# Patient Record
Sex: Male | Born: 1965 | Race: White | Hispanic: No | Marital: Single | Smoking: Current every day smoker
Health system: Southern US, Community
[De-identification: ages and names within clinical notes are randomized; demographics above are authoritative.]

## PROBLEM LIST (undated history)

## (undated) DIAGNOSIS — Z720 Tobacco use: Secondary | ICD-10-CM

## (undated) DIAGNOSIS — F1010 Alcohol abuse, uncomplicated: Secondary | ICD-10-CM

---

## 2020-08-13 ENCOUNTER — Encounter: Payer: Self-pay | Admitting: Emergency Medicine

## 2020-08-13 ENCOUNTER — Emergency Department: Payer: BC Managed Care – PPO

## 2020-08-13 ENCOUNTER — Other Ambulatory Visit: Payer: Self-pay

## 2020-08-13 ENCOUNTER — Inpatient Hospital Stay
Admission: EM | Admit: 2020-08-13 | Discharge: 2020-08-15 | DRG: 308 | Disposition: A | Discharge status: Home / Self Care | Payer: BC Managed Care – PPO | Attending: Internal Medicine | Admitting: Internal Medicine

## 2020-08-13 DIAGNOSIS — F419 Anxiety disorder, unspecified: Secondary | ICD-10-CM | POA: Diagnosis present

## 2020-08-13 DIAGNOSIS — E118 Type 2 diabetes mellitus with unspecified complications: Secondary | ICD-10-CM | POA: Diagnosis not present

## 2020-08-13 DIAGNOSIS — R739 Hyperglycemia, unspecified: Secondary | ICD-10-CM | POA: Diagnosis present

## 2020-08-13 DIAGNOSIS — I10 Essential (primary) hypertension: Secondary | ICD-10-CM | POA: Diagnosis present

## 2020-08-13 DIAGNOSIS — I480 Paroxysmal atrial fibrillation: Principal | ICD-10-CM | POA: Diagnosis present

## 2020-08-13 DIAGNOSIS — Z808 Family history of malignant neoplasm of other organs or systems: Secondary | ICD-10-CM | POA: Diagnosis not present

## 2020-08-13 DIAGNOSIS — G4700 Insomnia, unspecified: Secondary | ICD-10-CM | POA: Diagnosis present

## 2020-08-13 DIAGNOSIS — K5900 Constipation, unspecified: Secondary | ICD-10-CM | POA: Diagnosis present

## 2020-08-13 DIAGNOSIS — E871 Hypo-osmolality and hyponatremia: Secondary | ICD-10-CM | POA: Diagnosis present

## 2020-08-13 DIAGNOSIS — R7303 Prediabetes: Secondary | ICD-10-CM | POA: Diagnosis not present

## 2020-08-13 DIAGNOSIS — K2980 Duodenitis without bleeding: Secondary | ICD-10-CM | POA: Diagnosis not present

## 2020-08-13 DIAGNOSIS — Z720 Tobacco use: Secondary | ICD-10-CM

## 2020-08-13 DIAGNOSIS — F1010 Alcohol abuse, uncomplicated: Secondary | ICD-10-CM | POA: Diagnosis present

## 2020-08-13 DIAGNOSIS — G4733 Obstructive sleep apnea (adult) (pediatric): Secondary | ICD-10-CM | POA: Diagnosis present

## 2020-08-13 DIAGNOSIS — R109 Unspecified abdominal pain: Secondary | ICD-10-CM | POA: Diagnosis not present

## 2020-08-13 DIAGNOSIS — E878 Other disorders of electrolyte and fluid balance, not elsewhere classified: Secondary | ICD-10-CM | POA: Diagnosis present

## 2020-08-13 DIAGNOSIS — R079 Chest pain, unspecified: Secondary | ICD-10-CM | POA: Diagnosis not present

## 2020-08-13 DIAGNOSIS — I4891 Unspecified atrial fibrillation: Secondary | ICD-10-CM | POA: Diagnosis not present

## 2020-08-13 DIAGNOSIS — Z6837 Body mass index (BMI) 37.0-37.9, adult: Secondary | ICD-10-CM | POA: Diagnosis not present

## 2020-08-13 DIAGNOSIS — F17210 Nicotine dependence, cigarettes, uncomplicated: Secondary | ICD-10-CM | POA: Diagnosis not present

## 2020-08-13 DIAGNOSIS — I700 Atherosclerosis of aorta: Secondary | ICD-10-CM | POA: Diagnosis not present

## 2020-08-13 DIAGNOSIS — K852 Alcohol induced acute pancreatitis without necrosis or infection: Secondary | ICD-10-CM | POA: Diagnosis present

## 2020-08-13 DIAGNOSIS — Z20822 Contact with and (suspected) exposure to covid-19: Secondary | ICD-10-CM | POA: Diagnosis present

## 2020-08-13 DIAGNOSIS — I160 Hypertensive urgency: Secondary | ICD-10-CM | POA: Diagnosis present

## 2020-08-13 HISTORY — DX: Tobacco use: Z72.0

## 2020-08-13 HISTORY — DX: Alcohol abuse, uncomplicated: F10.10

## 2020-08-13 LAB — COMPREHENSIVE METABOLIC PANEL
ALT: 26 U/L (ref 0–44)
AST: 25 U/L (ref 15–41)
Albumin: 4.2 g/dL (ref 3.5–5.0)
Alkaline Phosphatase: 71 U/L (ref 38–126)
Anion gap: 10 (ref 5–15)
BUN: 11 mg/dL (ref 6–20)
CO2: 27 mmol/L (ref 22–32)
Calcium: 9.6 mg/dL (ref 8.9–10.3)
Chloride: 97 mmol/L — ABNORMAL LOW (ref 98–111)
Creatinine, Ser: 0.8 mg/dL (ref 0.61–1.24)
GFR, Estimated: >60 mL/min (ref >60)
Glucose, Bld: 138 mg/dL — ABNORMAL HIGH (ref 70–99)
Potassium: 3.9 mmol/L (ref 3.5–5.1)
Sodium: 134 mmol/L — ABNORMAL LOW (ref 135–145)
Total Bilirubin: 1.3 mg/dL — ABNORMAL HIGH (ref 0.3–1.2)
Total Protein: 8.2 g/dL — ABNORMAL HIGH (ref 6.5–8.1)

## 2020-08-13 LAB — CBC
HCT: 51.2 % (ref 39.0–52.0)
Hemoglobin: 17.9 g/dL — ABNORMAL HIGH (ref 13.0–17.0)
MCH: 31.2 pg (ref 26.0–34.0)
MCHC: 35 g/dL (ref 30.0–36.0)
MCV: 89.4 fL (ref 80.0–100.0)
Platelets: 261 10*3/uL (ref 150–400)
RBC: 5.73 MIL/uL (ref 4.22–5.81)
RDW: 11.8 % (ref 11.5–15.5)
WBC: 17.6 10*3/uL — ABNORMAL HIGH (ref 4.0–10.5)
nRBC: 0 % (ref 0.0–0.2)

## 2020-08-13 LAB — APTT: aPTT: 34 seconds (ref 24–36)

## 2020-08-13 LAB — URINALYSIS, COMPLETE (UACMP) WITH MICROSCOPIC
Bacteria, UA: NONE SEEN
Bilirubin Urine: NEGATIVE
Glucose, UA: NEGATIVE mg/dL
Ketones, ur: NEGATIVE mg/dL
Leukocytes,Ua: NEGATIVE
Nitrite: NEGATIVE
Protein, ur: NEGATIVE mg/dL
Specific Gravity, Urine: 1.009 (ref 1.005–1.030)
pH: 7 (ref 5.0–8.0)

## 2020-08-13 LAB — TROPONIN I (HIGH SENSITIVITY)
Troponin I (High Sensitivity): 15 ng/L (ref <18)
Troponin I (High Sensitivity): 19 ng/L — ABNORMAL HIGH (ref <18)

## 2020-08-13 LAB — T4, FREE: Free T4: 1.1 ng/dL (ref 0.61–1.12)

## 2020-08-13 LAB — RESP PANEL BY RT-PCR (FLU A&B, COVID) ARPGX2
Influenza A by PCR: NEGATIVE
Influenza B by PCR: NEGATIVE
SARS Coronavirus 2 by RT PCR: NEGATIVE

## 2020-08-13 LAB — BRAIN NATRIURETIC PEPTIDE: B Natriuretic Peptide: 122.4 pg/mL — ABNORMAL HIGH (ref 0.0–100.0)

## 2020-08-13 LAB — PROTIME-INR
INR: 1.2 (ref 0.8–1.2)
Prothrombin Time: 14.7 seconds (ref 11.4–15.2)

## 2020-08-13 LAB — LIPASE, BLOOD: Lipase: 38 U/L (ref 11–51)

## 2020-08-13 LAB — HEPARIN LEVEL (UNFRACTIONATED): Heparin Unfractionated: 0.18 IU/mL — ABNORMAL LOW (ref 0.30–0.70)

## 2020-08-13 LAB — TSH: TSH: 2.647 u[IU]/mL (ref 0.350–4.500)

## 2020-08-13 LAB — LACTIC ACID, PLASMA: Lactic Acid, Venous: 1.4 mmol/L (ref 0.5–1.9)

## 2020-08-13 LAB — ETHANOL: Alcohol, Ethyl (B): <10 mg/dL (ref <10)

## 2020-08-13 MED ORDER — SODIUM CHLORIDE 0.9 % IV BOLUS
1000.0000 mL | Freq: Once | INTRAVENOUS | Status: AC
  Administered 2020-08-13: 1000 mL via INTRAVENOUS

## 2020-08-13 MED ORDER — SODIUM CHLORIDE 0.9 % IV SOLN: INTRAVENOUS | Status: DC

## 2020-08-13 MED ORDER — HYDROMORPHONE HCL 1 MG/ML IJ SOLN
0.5000 mg | Freq: Once | INTRAMUSCULAR | Status: AC
  Administered 2020-08-13: 0.5 mg via INTRAVENOUS
  Filled 2020-08-13: qty 1

## 2020-08-13 MED ORDER — PANTOPRAZOLE SODIUM 40 MG IV SOLR
40.0000 mg | Freq: Once | INTRAVENOUS | Status: AC
  Administered 2020-08-13: 40 mg via INTRAVENOUS
  Filled 2020-08-13: qty 40

## 2020-08-13 MED ORDER — HEPARIN (PORCINE) 25000 UT/250ML-% IV SOLN
2100.0000 [IU]/h | INTRAVENOUS | Status: DC
  Administered 2020-08-13: 1500 [IU]/h via INTRAVENOUS
  Administered 2020-08-14: 1900 [IU]/h via INTRAVENOUS
  Administered 2020-08-14 – 2020-08-15 (×2): 2100 [IU]/h via INTRAVENOUS
  Filled 2020-08-13 (×4): qty 250

## 2020-08-13 MED ORDER — IOHEXOL 350 MG/ML SOLN
100.0000 mL | Freq: Once | INTRAVENOUS | Status: AC | PRN
  Administered 2020-08-13: 100 mL via INTRAVENOUS

## 2020-08-13 MED ORDER — LORAZEPAM 2 MG/ML IJ SOLN
1.0000 mg | INTRAMUSCULAR | Status: DC | PRN
  Administered 2020-08-14: 1 mg via INTRAVENOUS
  Filled 2020-08-13: qty 1

## 2020-08-13 MED ORDER — ONDANSETRON HCL 4 MG/2ML IJ SOLN
4.0000 mg | Freq: Once | INTRAMUSCULAR | Status: AC
  Administered 2020-08-13: 4 mg via INTRAVENOUS
  Filled 2020-08-13: qty 2

## 2020-08-13 MED ORDER — ONDANSETRON HCL 4 MG PO TABS: 4.0000 mg | ORAL_TABLET | Freq: Four times a day (QID) | ORAL | Status: DC | PRN

## 2020-08-13 MED ORDER — ENOXAPARIN SODIUM 40 MG/0.4ML IJ SOSY: 40.0000 mg | PREFILLED_SYRINGE | INTRAMUSCULAR | Status: DC

## 2020-08-13 MED ORDER — DILTIAZEM HCL 25 MG/5ML IV SOLN
15.0000 mg | Freq: Once | INTRAVENOUS | Status: AC
  Administered 2020-08-13: 15 mg via INTRAVENOUS
  Filled 2020-08-13: qty 5

## 2020-08-13 MED ORDER — MORPHINE SULFATE (PF) 2 MG/ML IV SOLN
2.0000 mg | INTRAVENOUS | Status: DC | PRN
  Administered 2020-08-13 – 2020-08-14 (×3): 2 mg via INTRAVENOUS
  Filled 2020-08-13 (×3): qty 1

## 2020-08-13 MED ORDER — DILTIAZEM HCL-DEXTROSE 125-5 MG/125ML-% IV SOLN (PREMIX)
5.0000 mg/h | INTRAVENOUS | Status: DC
Start: 2020-08-13 — End: 2020-08-14
  Administered 2020-08-13: 5 mg/h via INTRAVENOUS
  Filled 2020-08-13: qty 125

## 2020-08-13 MED ORDER — DILTIAZEM HCL 25 MG/5ML IV SOLN
25.0000 mg | Freq: Once | INTRAVENOUS | Status: AC
  Administered 2020-08-13: 25 mg via INTRAVENOUS
  Filled 2020-08-13: qty 5

## 2020-08-13 MED ORDER — ONDANSETRON HCL 4 MG/2ML IJ SOLN
4.0000 mg | Freq: Four times a day (QID) | INTRAMUSCULAR | Status: DC | PRN
  Administered 2020-08-13: 4 mg via INTRAVENOUS
  Filled 2020-08-13: qty 2

## 2020-08-13 MED ORDER — HEPARIN BOLUS VIA INFUSION
6000.0000 [IU] | Freq: Once | INTRAVENOUS | Status: AC
  Administered 2020-08-13: 6000 [IU] via INTRAVENOUS
  Filled 2020-08-13: qty 6000

## 2020-08-13 MED ORDER — THIAMINE HCL 100 MG/ML IJ SOLN
Freq: Once | INTRAVENOUS | Status: AC
  Filled 2020-08-13: qty 1000

## 2020-08-13 MED ORDER — ACETAMINOPHEN 650 MG RE SUPP: 650.0000 mg | Freq: Four times a day (QID) | RECTAL | Status: DC | PRN

## 2020-08-13 MED ORDER — ACETAMINOPHEN 325 MG PO TABS
650.0000 mg | ORAL_TABLET | Freq: Four times a day (QID) | ORAL | Status: DC | PRN
  Administered 2020-08-13 – 2020-08-14 (×3): 650 mg via ORAL
  Filled 2020-08-13 (×3): qty 2

## 2020-08-13 MED ORDER — TRAZODONE HCL 50 MG PO TABS
25.0000 mg | ORAL_TABLET | Freq: Every evening | ORAL | Status: DC | PRN
  Administered 2020-08-14: 25 mg via ORAL
  Filled 2020-08-13: qty 1

## 2020-08-13 MED ORDER — PANTOPRAZOLE SODIUM 40 MG IV SOLR
40.0000 mg | Freq: Two times a day (BID) | INTRAVENOUS | Status: DC
  Administered 2020-08-13 – 2020-08-14 (×2): 40 mg via INTRAVENOUS
  Filled 2020-08-13 (×2): qty 40

## 2020-08-13 MED ORDER — ENOXAPARIN SODIUM 60 MG/0.6ML IJ SOSY: 0.5000 mg/kg | PREFILLED_SYRINGE | INTRAMUSCULAR | Status: DC

## 2020-08-13 MED ORDER — DILTIAZEM HCL 30 MG PO TABS
30.0000 mg | ORAL_TABLET | Freq: Four times a day (QID) | ORAL | Status: DC
  Administered 2020-08-13 – 2020-08-14 (×4): 30 mg via ORAL
  Filled 2020-08-13 (×4): qty 1

## 2020-08-13 NOTE — Progress Notes (Signed)
PHARMACIST - PHYSICIAN COMMUNICATION  CONCERNING:  Enoxaparin (Lovenox) for DVT Prophylaxis    RECOMMENDATION: Patient was prescribed enoxaprin 40mg  q24 hours for VTE prophylaxis.   Filed Weights   08/13/20 0832  Weight: 117.9 kg (260 lb)    Body mass index is 37.31 kg/m.  Estimated Creatinine Clearance: 132.7 mL/min (by C-G formula based on SCr of 0.8 mg/dL).   Based on [facility] policy patient is candidate for enoxaparin 0.5mg /kg TBW SQ every 24 hours based on BMI being >30.   DESCRIPTION: Pharmacy has adjusted enoxaparin dose per [facility] policy.  Patient is now receiving enoxaparin 60 mg every 24 hours    [facility], PharmD, BCPS Clinical Pharmacist 08/13/2020 12:17 PM

## 2020-08-13 NOTE — ED Notes (Signed)
Doctor notified pt HR ranging from 170 to 150's

## 2020-08-13 NOTE — Consult Note (Signed)
Cardiology Consult    Patient ID: DONEL OSOWSKI MRN: [id number], DOB/AGE: 1965/04/22   Admit date: 08/13/2020 Date of Consult: 08/13/2020  Primary Physician: Oneita Hurt, No Primary Cardiologist: Julien Nordmann, MD Requesting Provider: Andrez Grime, MD  Patient Profile    NAHUEL WILBERT is a 55 y.o. male with a history of etoh and tobacco abuse, who is being seen today for the evaluation of afib w/ RVR in the setting of acute pancreatits at the request of Dr. Arville Care.  Past Medical History   Past Medical History:  Diagnosis Date  . ETOH abuse   . Tobacco abuse     History reviewed. No pertinent surgical history.   Allergies  No Known Allergies  History of Present Illness    55 y/o ? w/ a h/o tobacco and etoh abuse.  He has no prior cardiac hx.  Over the past week, he has been having diffuse, and progressively worsening upper and lower abdominal pain and tenderness associated w/ anorexia and constipation.  Though he was drinking 3 alcoholic beverages/day previously, he has not had a drink since symptoms started.  Over the same course of time, he has been noting increasing dyspnea w/o chest pain or palpitations.  Due to unrelenting abd pain, he presented ot the ED this AM and was found to be in AFib w/ RVR @ 182 bpm.  He was placed on IV dilt w/ improved rate control and has since been transitioned to oral dilt and has converted to sinus rhythm.   Lab eval has been relatively unremarkable w/ the exception of mild HsTrop elevation - 15  19.  CT of the chest/abd/pelvis was negative for PE but did suggest acute pancreatitis.  We have been asked to eval r/t afib.  Pt currently c/o 6/10 abd discomfort but denies c/p or dyspnea.  Inpatient Medications    . diltiazem  30 mg Oral Q6H  . enoxaparin (LOVENOX) injection  0.5 mg/kg Subcutaneous Q24H  . pantoprazole (PROTONIX) IV  40 mg Intravenous Q12H    Family History    No family history on file. He indicated that his mother is deceased. He  indicated that his father is deceased.   Social History    Social History   Socioeconomic History  . Marital status: Single    Spouse name: Not on file  . Number of children: Not on file  . Years of education: Not on file  . Highest education level: Not on file  Occupational History  . Not on file  Tobacco Use  . Smoking status: Current Every Day Smoker  . Smokeless tobacco: Never Used  Substance and Sexual Activity  . Alcohol use: Yes    Comment: pt states that he just stopped drinking, pt drink 3 beers per night  . Drug use: Not on file  . Sexual activity: Not on file  Other Topics Concern  . Not on file  Social History Narrative   Lives locally by himself.  Does not routinely exercise.   Social Determinants of Health   Financial Resource Strain: Not on file  Food Insecurity: Not on file  Transportation Needs: Not on file  Physical Activity: Not on file  Stress: Not on file  Social Connections: Not on file  Intimate Partner Violence: Not on file     Review of Systems    General:  No chills, fever, night sweats or weight changes.  Cardiovascular:  No chest pain, +++ dyspnea, no edema, orthopnea, palpitations, paroxysmal nocturnal dyspnea. Dermatological:  No rash, lesions/masses Respiratory: No cough, +++ dyspnea Urologic: No hematuria, dysuria Abdominal:   +++ nausea, +++ constipation, +++ abd pain, no vomiting, diarrhea, bright red blood per rectum, melena, or hematemesis Neurologic:  No visual changes, wkns, changes in mental status. All other systems reviewed and are otherwise negative except as noted above.  Physical Exam    Blood pressure (!) 152/82, pulse 73, temperature 98.5 F (36.9 C), temperature source Oral, resp. rate 18, height 5\' 10"  (1.778 m), weight 117.9 kg, SpO2 95 %.  General: Pleasant, NAD Psych: Normal affect. Neuro: Alert and oriented X 3. Moves all extremities spontaneously. HEENT: Normal  Neck: Supple without bruits or JVD. Lungs:   Resp regular and unlabored, CTA. Heart: RRR no s3, s4, or murmurs. Abdomen: Soft, diffusely tender, non-distended, BS + x 4.  Extremities: No clubbing, cyanosis or edema. DP/PT2+, Radials 2+ and equal bilaterally.  Labs    Cardiac Enzymes Recent Labs  Lab 08/13/20 0914 08/13/20 1159  TROPONINIHS 15 19*      Lab Results  Component Value Date   WBC 17.6 (H) 08/13/2020   HGB 17.9 (H) 08/13/2020   HCT 51.2 08/13/2020   MCV 89.4 08/13/2020   PLT 261 08/13/2020    Recent Labs  Lab 08/13/20 0837  NA 134*  K 3.9  CL 97*  CO2 27  BUN 11  CREATININE 0.80  CALCIUM 9.6  PROT 8.2*  BILITOT 1.3*  ALKPHOS 71  ALT 26  AST 25  GLUCOSE 138*   Lab Results  Component Value Date   TSH 2.647 08/13/2020    Radiology Studies    CTA Chest/ABDOMEN PELVIS W CONTRAST  Result Date: 08/13/2020 CLINICAL DATA:  Abdominal and back pain for the past week. EXAM: CT ANGIOGRAPHY CHEST CT ABDOMEN AND PELVIS WITH CONTRAST TECHNIQUE: Multidetector CT imaging of the chest was performed using the standard protocol during bolus administration of intravenous contrast. Multiplanar CT image reconstructions and MIPs were obtained to evaluate the vascular anatomy. Multidetector CT imaging of the abdomen and pelvis was performed using the standard protocol during bolus administration of intravenous contrast. CONTRAST:  08/15/2020 OMNIPAQUE IOHEXOL 350 MG/ML SOLN COMPARISON:  None. FINDINGS: CTA CHEST FINDINGS Cardiovascular: Satisfactory opacification of the pulmonary arteries to the segmental level. No evidence of pulmonary embolism. Normal heart size. No pericardial effusion. No thoracic aortic aneurysm or dissection. Mediastinum/Nodes: No enlarged mediastinal, hilar, or axillary lymph nodes. Thyroid gland, trachea, and esophagus demonstrate no significant findings. Lungs/Pleura: Lungs are clear. No pleural effusion or pneumothorax. Musculoskeletal: No chest wall abnormality. No acute or significant osseous findings.  Review of the MIP images confirms the above findings. CT ABDOMEN AND PELVIS FINDINGS Hepatobiliary: Tiny subcentimeter low-density lesion in the peripheral right hepatic lobe, too small to characterize. No other focal liver abnormality. The gallbladder is unremarkable. No biliary dilatation. Pancreas: Ill-defined inferior pancreatic head and uncinate process with surrounding inflammatory changes (series 2, image 39). No ductal dilatation. Spleen: Normal in size without focal abnormality. Adrenals/Urinary Tract: Adrenal glands are unremarkable. Kidneys are normal, without renal calculi, focal lesion, or hydronephrosis. Partially duplicated right renal collecting system. Bladder is unremarkable. Stomach/Bowel: Stomach is within normal limits. Appendix appears normal. No evidence of bowel wall thickening, distention, or inflammatory changes. Vascular/Lymphatic: Aortic atherosclerosis. No enlarged abdominal or pelvic lymph nodes. Reproductive: Prostate is unremarkable. Other: No abdominal wall hernia or abnormality. No abdominopelvic ascites. No pneumoperitoneum. Musculoskeletal: No acute or significant osseous findings. Review of the MIP images confirms the above findings. IMPRESSION: CT chest: 1. No  evidence of pulmonary embolism. No acute intrathoracic process. CT abdomen pelvis: 1. Ill-defined inferior pancreatic head and uncinate process with surrounding inflammatory changes, suggestive of acute pancreatitis. The differential diagnosis also includes duodenitis, although there is no overt duodenal wall thickening. 2. Aortic Atherosclerosis (ICD10-I70.0). Electronically Signed   By: Obie Dredge M.D.   On: 08/13/2020 10:18    ECG & Cardiac Imaging    4/29 @ 0916:  Afib, 182, RAD, inferolateral ST depression - personally reviewed. 4/29 @  1029: Afib, 107, inf ST depression - personally reviewed.  Assessment & Plan    1. Afib w/ RVR: pt presented w/ a 1 wk h/o progressive abd pain and dyspnea w/ acute  pancreatitis.  He was found to be in Afib w/ RVR, initially @ 182. He responded well to IV dilt and has since converted to sinus rhythm.  Lytes/TSH ok.  CHA2DS2VASc = 0 w/ current known medical history though BP and blood glucose elevated.  Ao atherosclerosis also noted on CT.  Echo pending.  We will add heparin for now and consider initiation of OAC if appropriate.  Cont PO dilt and we can look to consolidate of change to  blocker if EF down.  2.  Acute pancreatitis:  Mgmt per IM.  Still 6/10 abd discomfort.  3.  Tobacco/ETOH abuse:  No drinking in a week.  Complete cessation advised.  4.  Elevated HsTrop:  Very mildly elevated @ 19 in the setting of rapid afib.  Follow.  Consider outpt ischemic eval pending echo.  Signed, Nicolasa Ducking, NP 08/13/2020, 4:03 PM  For questions or updates, please contact   Please consult [email or web address] for contact info under Cardiology/STEMI.

## 2020-08-13 NOTE — ED Notes (Addendum)
cardizem drip stopped per Dr Arville Care. Verbal order for PO cardizem 30 mg q6h

## 2020-08-13 NOTE — H&P (Addendum)
[place]   PATIENT NAME: Herbert Daniel    MR#:  [id number]  DATE OF BIRTH:  [birth year]  DATE OF ADMISSION:  08/13/2020  PRIMARY CARE PHYSICIAN: Pcp, No   Patient is coming from: Home.  REQUESTING/REFERRING PHYSICIAN: Artis Delay, MD  CHIEF COMPLAINT:   Chief Complaint  Patient presents with  . Abdominal Pain  . Back Pain    HISTORY OF PRESENT ILLNESS:  Herbert Daniel is a 55 y.o. Caucasian male with medical history significant for ongoing tobacco abuse and possibly alcohol abuse, who presented to the emergency room with a [place] of epigastric left upper quadrant and slight right upper quadrant abdominal pain for the last week.  His last alcoholic drink was a week ago.  He usually drinks about 3 alcoholic drinks per day.  He denied any nausea or vomiting or diarrhea.  He admitted to constipation.  He denied any palpitations or chest pain.  No cough or wheezing or hemoptysis.  No dysuria, oliguria or hematuria or flank pain.  No other bleeding diathesis. ED Course: When he came to the ER, heart rate was 130 and later 150 with a blood pressure of 148/96 and otherwise normal vital signs.  Labs revealed significant cytosis of 17.6 and hemoconcentration, lactic acid 1.4, high-sensitivity troponin I 15 and BNP of 122.4 and CMP showed mild hyponatremia and hypochloremia.  Lipase level was 38.  Influenza antigens and COVID-19 PCR came back negative.  TSH was 2.64 and free T4 1.1.  UA was unremarkable and alcohol levels less than 10.  Add to blood cultures drawn. EKG as reviewed by me :EKG showed atrial fibrillation with RVR of 182. Imaging: Abdominal and pelvic CT scan revealed aortic atherosclerosis and the ill-defined inferior pancreatic head and uncinate process with surrounding inflammatory changes suggestive of acute pancreatitis.  Differential diagnosis would include duodenitis although there is no overt duodenal wall thickening.  The patient was given IV diltiazem bolus  15 and later 25 mg followed by IV diltiazem drip, 4 mg of IV Zofran, 40 mg gram of IV Protonix and 2 L bolus of IV normal saline.  He will be admitted to progressive unit bed for further evaluation and management. PAST MEDICAL HISTORY:  -Tobacco abuse. - Possible alcohol abuse.  PAST SURGICAL HISTORY:  History reviewed. No pertinent surgical history.  He denies any previous surgeries.  SOCIAL HISTORY:   Social History   Tobacco Use  . Smoking status: Current Every Day Smoker  . Smokeless tobacco: Never Used  Substance Use Topics  . Alcohol use: Yes    Comment: pt states that he just stopped drinking, pt drink 3 beers per night    FAMILY HISTORY:    His father died from throat cancer DRUG ALLERGIES:  No Known Allergies  REVIEW OF SYSTEMS:   ROS As per history of present illness. All pertinent systems were reviewed above. Constitutional, HEENT, cardiovascular, respiratory, GI, GU, musculoskeletal, neuro, psychiatric, endocrine, integumentary and hematologic systems were reviewed and are otherwise negative/unremarkable except for positive findings mentioned above in the HPI.   MEDICATIONS AT HOME:   Prior to Admission medications   Not on File      VITAL SIGNS:  Blood pressure (!) 148/96, pulse (!) 130, temperature 98.5 F (36.9 C), temperature source Oral, resp. rate 18, height 5\' 10"  (1.778 m), weight 117.9 kg, SpO2 95 %.  PHYSICAL EXAMINATION:  Physical Exam  GENERAL:  55 y.o.-year-old Caucasian male patient lying in the bed with no acute  distress.  EYES: Pupils equal, round, reactive to light and accommodation. No scleral icterus. Extraocular muscles intact.  HEENT: Head atraumatic, normocephalic. Oropharynx and nasopharynx clear.  NECK:  Supple, no jugular venous distention. No thyroid enlargement, no tenderness.  LUNGS: Normal breath sounds bilaterally, no wheezing, rales,rhonchi or crepitation. No use of accessory muscles of respiration.  CARDIOVASCULAR: Regular  rate and rhythm, S1, S2 normal. No murmurs, rubs, or gallops.  ABDOMEN: Soft, nondistended, with mild epigastric, left upper quadrant and right upper quadrant tenderness without rebound tenderness guarding or rigidity.. Bowel sounds present. No organomegaly or mass.  EXTREMITIES: No pedal edema, cyanosis, or clubbing.  NEUROLOGIC: Cranial nerves II through XII are intact. Muscle strength 5/5 in all extremities. Sensation intact. Gait not checked.  PSYCHIATRIC: The patient is alert and oriented x 3.  Normal affect and good eye contact. SKIN: No obvious rash, lesion, or ulcer.   LABORATORY PANEL:   CBC Recent Labs  Lab 08/13/20 0837  WBC 17.6*  HGB 17.9*  HCT 51.2  PLT 261   ------------------------------------------------------------------------------------------------------------------  Chemistries  Recent Labs  Lab 08/13/20 0837  NA 134*  K 3.9  CL 97*  CO2 27  GLUCOSE 138*  BUN 11  CREATININE 0.80  CALCIUM 9.6  AST 25  ALT 26  ALKPHOS 71  BILITOT 1.3*   ------------------------------------------------------------------------------------------------------------------  Cardiac Enzymes No results for input(s): TROPONINI in the last 168 hours. ------------------------------------------------------------------------------------------------------------------  RADIOLOGY:  CT Angio Chest PE W and/or Wo Contrast  Result Date: 08/13/2020 CLINICAL DATA:  Abdominal and back pain for the past week. EXAM: CT ANGIOGRAPHY CHEST CT ABDOMEN AND PELVIS WITH CONTRAST TECHNIQUE: Multidetector CT imaging of the chest was performed using the standard protocol during bolus administration of intravenous contrast. Multiplanar CT image reconstructions and MIPs were obtained to evaluate the vascular anatomy. Multidetector CT imaging of the abdomen and pelvis was performed using the standard protocol during bolus administration of intravenous contrast. CONTRAST:  OMNIPAQUE IOHEXOL 350 MG/ML  SOLN COMPARISON:  None. FINDINGS: CTA CHEST FINDINGS Cardiovascular: Satisfactory opacification of the pulmonary arteries to the segmental level. No evidence of pulmonary embolism. Normal heart size. No pericardial effusion. No thoracic aortic aneurysm or dissection. Mediastinum/Nodes: No enlarged mediastinal, hilar, or axillary lymph nodes. Thyroid gland, trachea, and esophagus demonstrate no significant findings. Lungs/Pleura: Lungs are clear. No pleural effusion or pneumothorax. Musculoskeletal: No chest wall abnormality. No acute or significant osseous findings. Review of the MIP images confirms the above findings. CT ABDOMEN AND PELVIS FINDINGS Hepatobiliary: Tiny subcentimeter low-density lesion in the peripheral right hepatic lobe, too small to characterize. No other focal liver abnormality. The gallbladder is unremarkable. No biliary dilatation. Pancreas: Ill-defined inferior pancreatic head and uncinate process with surrounding inflammatory changes (series 2, image 39). No ductal dilatation. Spleen: Normal in size without focal abnormality. Adrenals/Urinary Tract: Adrenal glands are unremarkable. Kidneys are normal, without renal calculi, focal lesion, or hydronephrosis. Partially duplicated right renal collecting system. Bladder is unremarkable. Stomach/Bowel: Stomach is within normal limits. Appendix appears normal. No evidence of bowel wall thickening, distention, or inflammatory changes. Vascular/Lymphatic: Aortic atherosclerosis. No enlarged abdominal or pelvic lymph nodes. Reproductive: Prostate is unremarkable. Other: No abdominal wall hernia or abnormality. No abdominopelvic ascites. No pneumoperitoneum. Musculoskeletal: No acute or significant osseous findings. Review of the MIP images confirms the above findings. IMPRESSION: CT chest: 1. No evidence of pulmonary embolism. No acute intrathoracic process. CT abdomen pelvis: 1. Ill-defined inferior pancreatic head and uncinate process with  surrounding inflammatory changes, suggestive of acute pancreatitis. The differential  diagnosis also includes duodenitis, although there is no overt duodenal wall thickening. 2. Aortic Atherosclerosis (ICD10-I70.0). Electronically Signed   By: Obie DredgeWilliam T Derry M.D.   On: 08/13/2020 10:18   CT ABDOMEN PELVIS W CONTRAST  Result Date: 08/13/2020 CLINICAL DATA:  Abdominal and back pain for the past week. EXAM: CT ANGIOGRAPHY CHEST CT ABDOMEN AND PELVIS WITH CONTRAST TECHNIQUE: Multidetector CT imaging of the chest was performed using the standard protocol during bolus administration of intravenous contrast. Multiplanar CT image reconstructions and MIPs were obtained to evaluate the vascular anatomy. Multidetector CT imaging of the abdomen and pelvis was performed using the standard protocol during bolus administration of intravenous contrast. CONTRAST:  100mL OMNIPAQUE IOHEXOL 350 MG/ML SOLN COMPARISON:  None. FINDINGS: CTA CHEST FINDINGS Cardiovascular: Satisfactory opacification of the pulmonary arteries to the segmental level. No evidence of pulmonary embolism. Normal heart size. No pericardial effusion. No thoracic aortic aneurysm or dissection. Mediastinum/Nodes: No enlarged mediastinal, hilar, or axillary lymph nodes. Thyroid gland, trachea, and esophagus demonstrate no significant findings. Lungs/Pleura: Lungs are clear. No pleural effusion or pneumothorax. Musculoskeletal: No chest wall abnormality. No acute or significant osseous findings. Review of the MIP images confirms the above findings. CT ABDOMEN AND PELVIS FINDINGS Hepatobiliary: Tiny subcentimeter low-density lesion in the peripheral right hepatic lobe, too small to characterize. No other focal liver abnormality. The gallbladder is unremarkable. No biliary dilatation. Pancreas: Ill-defined inferior pancreatic head and uncinate process with surrounding inflammatory changes (series 2, image 39). No ductal dilatation. Spleen: Normal in size without  focal abnormality. Adrenals/Urinary Tract: Adrenal glands are unremarkable. Kidneys are normal, without renal calculi, focal lesion, or hydronephrosis. Partially duplicated right renal collecting system. Bladder is unremarkable. Stomach/Bowel: Stomach is within normal limits. Appendix appears normal. No evidence of bowel wall thickening, distention, or inflammatory changes. Vascular/Lymphatic: Aortic atherosclerosis. No enlarged abdominal or pelvic lymph nodes. Reproductive: Prostate is unremarkable. Other: No abdominal wall hernia or abnormality. No abdominopelvic ascites. No pneumoperitoneum. Musculoskeletal: No acute or significant osseous findings. Review of the MIP images confirms the above findings. IMPRESSION: CT chest: 1. No evidence of pulmonary embolism. No acute intrathoracic process. CT abdomen pelvis: 1. Ill-defined inferior pancreatic head and uncinate process with surrounding inflammatory changes, suggestive of acute pancreatitis. The differential diagnosis also includes duodenitis, although there is no overt duodenal wall thickening. 2. Aortic Atherosclerosis (ICD10-I70.0). Electronically Signed   By: Obie DredgeWilliam T Derry M.D.   On: 08/13/2020 10:18      IMPRESSION AND PLAN:  Active Problems:   Atrial fibrillation with RVR (HCC)  1.  New onset atrial fibrillation with rapid ventricular response. - The patient will be admitted to a progressive unit bed. - We will continue him on IV Cardizem drip. - Pain management will be provided. - The patient will be kept n.p.o. for now. - Diet can be advanced from clear liquids to regular with resolution of his pain. - 2D echocardiac consult will be obtained. - I notified Dr. Mariah MillingGollan about the patient. - The patient's CHA2DS2-VASc score is 0.  Holding off aspirin given his possible duodenitis and pancreatitis  2.  Alcohol induced pancreatitis.  This is shown on abdominal CT despite normal lipase. - The patient will be kept NPO. - Pain management  will be provided. - Diet will be advanced as tolerated  3.  Possible duodenitis. - He will be placed on IV PPI therapy.  4.  Alcohol abuse. - Will be monitored for alcohol withdrawal. - We will place on as needed IV Ativan and a banana bag  daily.  5.  Tobacco abuse. - He was counseled for smoking cessation and will receive further counseling here.  6.  Insomnia. - Will be placed on as needed trazodone.   DVT prophylaxis: Lovenox. Code Status: full code. Family Communication:  The plan of care was discussed in details with the patient (who requested no other family members to be notified at this time). I answered all questions. The patient agreed to proceed with the above mentioned plan. Further management will depend upon hospital course. Disposition Plan: Back to previous home environment Consults called: Cardiology consult. All the records are reviewed and case discussed with ED provider.  Status is: Inpatient  Remains inpatient appropriate because:Ongoing diagnostic testing needed not appropriate for outpatient work up, Unsafe d/c plan, IV treatments appropriate due to intensity of illness or inability to take PO and Inpatient level of care appropriate due to severity of illness   Dispo: The patient is from: Home              Anticipated d/c is to: Home              Patient currently is not medically stable to d/c.   Difficult to place patient No  TOTAL TIME TAKING CARE OF THIS PATIENT: 55 minutes.    Hannah Beat M.D on 08/13/2020 at 10:45 AM  [facility]   From 7 PM-7 AM, contact night-coverage [email or web address]  CC: Primary care physician; Pcp, No

## 2020-08-13 NOTE — Plan of Care (Signed)

## 2020-08-13 NOTE — ED Triage Notes (Signed)
Pt to ED via POV c/o abdominal and back pain x 1 week. Pt states that the pain moves around. Pt states that he has been taking tylenol without relief. Pt denies N/V/D. Pt states that he thought he may be constipated so he took Ex-lax and that seemed to help some but as soon as it wore off the pain came back. Pt is in NAD.

## 2020-08-13 NOTE — Consult Note (Addendum)
ANTICOAGULATION CONSULT NOTE - Initial Consult  Pharmacy Consult for heparin infusion Indication: atrial fibrillation  No Known Allergies  Patient Measurements: Height: 5\' 10"  (177.8 cm) Weight: 117.9 kg (260 lb) IBW/kg (Calculated) : 73 Heparin Dosing Weight: 99.2 kg   Vital Signs: Temp: 98.5 F (36.9 C) (04/29 0831) Temp Source: Oral (04/29 0831) BP: 152/82 (04/29 1400) Pulse Rate: 73 (04/29 1400)  Labs: Recent Labs    08/13/20 0837 08/13/20 0914 08/13/20 1159  HGB 17.9*  --   --   HCT 51.2  --   --   PLT 261  --   --   CREATININE 0.80  --   --   TROPONINIHS  --  15 19*    Estimated Creatinine Clearance: 132.7 mL/min (by C-G formula based on SCr of 0.8 mg/dL).   Medical History: Past Medical History:  Diagnosis Date  . ETOH abuse   . Tobacco abuse     Medications:  No prior AC noted  Assessment: 55 y.o. male with medical history significant for alcohol abuse presented to ED with new onset Afib w/ RVR. Pharmacy has been consulted for initiationan and management of heparin infusion for A fib.   Heparin Dosing Weight: 99.2 kg  Baseline CBC reviewed. PT/INR, aPTT ordered   Goal of Therapy:  Heparin level 0.3-0.7 units/ml Monitor platelets by anticoagulation protocol: Yes   Plan:   Give 6000 units bolus x 1  Start heparin infusion at 1500 units/hr  Check anti-Xa level in 6 hours and daily while on heparin  Continue to monitor H&H and platelets  57, PharmD, BCPS 08/13/2020,4:40 PM

## 2020-08-13 NOTE — ED Provider Notes (Signed)
[facility] Emergency Department Provider Note  ____________________________________________   Event Date/Time   First MD Initiated Contact with Patient 08/13/20 [phone]     (approximate)  I have reviewed the triage vital signs and the nursing notes.   HISTORY  Chief Complaint Abdominal Pain and Back Pain    HPI Herbert Daniel is a 55 y.o. male otherwise healthy who comes in for abdominal pain.  Patient reports 1 week of abdominal pain.  Initially was in the lower abdomen but today more upper abdomen.  The pain is constant, not better with Tylenol, nothing makes it worse.  States that he used to drink frequently but has not had a alcoholic drink in 7 days.  Denies a history of withdrawal.  States that he has been feeling more short of breath as well.  No chest pain.  Patient reports hot flashes.       History reviewed. No pertinent past medical history.  There are no problems to display for this patient.   History reviewed. No pertinent surgical history.  Prior to Admission medications   Not on File    Allergies Patient has no known allergies.  No family history on file.  Social History Social History   Tobacco Use  . Smoking status: Current Every Day Smoker  . Smokeless tobacco: Never Used  Substance Use Topics  . Alcohol use: Yes    Comment: pt states that he just stopped drinking, pt drink 3 beers per night      Review of Systems Constitutional: No fever/chills, hot flashes Eyes: No visual changes. ENT: No sore throat. Cardiovascular: Denies chest pain. Respiratory: Positive shortness of breath Gastrointestinal: Positive abdominal pain radiating to back Genitourinary: Negative for dysuria. Musculoskeletal: Negative for back pain. Skin: Negative for rash. Neurological: Negative for headaches, focal weakness or numbness. All other ROS negative ____________________________________________   PHYSICAL EXAM:  VITAL  SIGNS: ED Triage Vitals  Enc Vitals Group     BP 08/13/20 0831 (!) 156/132     Pulse Rate 08/13/20 0831 88     Resp 08/13/20 0831 16     Temp 08/13/20 0831 98.5 F (36.9 C)     Temp Source 08/13/20 0831 Oral     SpO2 08/13/20 0831 98 %     Weight 08/13/20 0832 260 lb (117.9 kg)     Height 08/13/20 0832 5\' 10"  (1.778 m)     Head Circumference --      Peak Flow --      Pain Score 08/13/20 0830 6     Pain Loc --      Pain Edu? --      Excl. in GC? --     Constitutional: Alert and oriented. Well appearing and in no acute distress. Eyes: Conjunctivae are normal. EOMI. Head: Atraumatic. Nose: No congestion/rhinnorhea. Mouth/Throat: Mucous membranes are moist.   Neck: No stridor. Trachea Midline. FROM Cardiovascular: Irregular, tachycardic. Grossly normal heart sounds.  Good peripheral circulation. Respiratory: Normal respiratory effort.  No retractions. Lungs CTAB. Gastrointestinal: Tender in the upper abdomen. No distention. No abdominal bruits.  Musculoskeletal: No lower extremity tenderness nor edema.  No joint effusions. Neurologic:  Normal speech and language. No gross focal neurologic deficits are appreciated.  Skin:  Skin is warm, dry and intact. No rash noted. Psychiatric: Mood and affect are normal. Speech and behavior are normal. GU: Deferred   ____________________________________________   LABS (all labs ordered are listed, but only abnormal results are displayed)  Labs Reviewed  COMPREHENSIVE METABOLIC PANEL - Abnormal; Notable for the following components:      Result Value   Sodium 134 (*)    Chloride 97 (*)    Glucose, Bld 138 (*)    Total Protein 8.2 (*)    Total Bilirubin 1.3 (*)    All other components within normal limits  CBC - Abnormal; Notable for the following components:   WBC 17.6 (*)    Hemoglobin 17.9 (*)    All other components within normal limits  URINALYSIS, COMPLETE (UACMP) WITH MICROSCOPIC - Abnormal; Notable for the following  components:   Color, Urine YELLOW (*)    APPearance CLEAR (*)    Hgb urine dipstick MODERATE (*)    All other components within normal limits  BRAIN NATRIURETIC PEPTIDE - Abnormal; Notable for the following components:   B Natriuretic Peptide 122.4 (*)    All other components within normal limits  RESP PANEL BY RT-PCR (FLU A&B, COVID) ARPGX2  CULTURE, BLOOD (ROUTINE X 2)  CULTURE, BLOOD (ROUTINE X 2)  LIPASE, BLOOD  LACTIC ACID, PLASMA  ETHANOL  TSH  T4, FREE  LACTIC ACID, PLASMA  TROPONIN I (HIGH SENSITIVITY)   ____________________________________________   ED ECG REPORT I, Concha SeMary E Montee Tallman, the attending physician, personally viewed and interpreted this ECG.  Atrial fibrillation rate of 182, no ST elevation, T wave inversions in lead III, aVF and V6, normal intervals ____________________________________________  RADIOLOGY   Official radiology report(s): CT Angio Chest PE W and/or Wo Contrast  Result Date: 08/13/2020 CLINICAL DATA:  Abdominal and back pain for the past week. EXAM: CT ANGIOGRAPHY CHEST CT ABDOMEN AND PELVIS WITH CONTRAST TECHNIQUE: Multidetector CT imaging of the chest was performed using the standard protocol during bolus administration of intravenous contrast. Multiplanar CT image reconstructions and MIPs were obtained to evaluate the vascular anatomy. Multidetector CT imaging of the abdomen and pelvis was performed using the standard protocol during bolus administration of intravenous contrast. CONTRAST:  100mL OMNIPAQUE IOHEXOL 350 MG/ML SOLN COMPARISON:  None. FINDINGS: CTA CHEST FINDINGS Cardiovascular: Satisfactory opacification of the pulmonary arteries to the segmental level. No evidence of pulmonary embolism. Normal heart size. No pericardial effusion. No thoracic aortic aneurysm or dissection. Mediastinum/Nodes: No enlarged mediastinal, hilar, or axillary lymph nodes. Thyroid gland, trachea, and esophagus demonstrate no significant findings. Lungs/Pleura:  Lungs are clear. No pleural effusion or pneumothorax. Musculoskeletal: No chest wall abnormality. No acute or significant osseous findings. Review of the MIP images confirms the above findings. CT ABDOMEN AND PELVIS FINDINGS Hepatobiliary: Tiny subcentimeter low-density lesion in the peripheral right hepatic lobe, too small to characterize. No other focal liver abnormality. The gallbladder is unremarkable. No biliary dilatation. Pancreas: Ill-defined inferior pancreatic head and uncinate process with surrounding inflammatory changes (series 2, image 39). No ductal dilatation. Spleen: Normal in size without focal abnormality. Adrenals/Urinary Tract: Adrenal glands are unremarkable. Kidneys are normal, without renal calculi, focal lesion, or hydronephrosis. Partially duplicated right renal collecting system. Bladder is unremarkable. Stomach/Bowel: Stomach is within normal limits. Appendix appears normal. No evidence of bowel wall thickening, distention, or inflammatory changes. Vascular/Lymphatic: Aortic atherosclerosis. No enlarged abdominal or pelvic lymph nodes. Reproductive: Prostate is unremarkable. Other: No abdominal wall hernia or abnormality. No abdominopelvic ascites. No pneumoperitoneum. Musculoskeletal: No acute or significant osseous findings. Review of the MIP images confirms the above findings. IMPRESSION: CT chest: 1. No evidence of pulmonary embolism. No acute intrathoracic process. CT abdomen pelvis: 1. Ill-defined inferior pancreatic head and uncinate process with surrounding inflammatory changes, suggestive of  acute pancreatitis. The differential diagnosis also includes duodenitis, although there is no overt duodenal wall thickening. 2. Aortic Atherosclerosis (ICD10-I70.0). Electronically Signed   By: Obie Dredge M.D.   On: 08/13/2020 10:18   CT ABDOMEN PELVIS W CONTRAST  Result Date: 08/13/2020 CLINICAL DATA:  Abdominal and back pain for the past week. EXAM: CT ANGIOGRAPHY CHEST CT ABDOMEN  AND PELVIS WITH CONTRAST TECHNIQUE: Multidetector CT imaging of the chest was performed using the standard protocol during bolus administration of intravenous contrast. Multiplanar CT image reconstructions and MIPs were obtained to evaluate the vascular anatomy. Multidetector CT imaging of the abdomen and pelvis was performed using the standard protocol during bolus administration of intravenous contrast. CONTRAST:  OMNIPAQUE IOHEXOL 350 MG/ML SOLN COMPARISON:  None. FINDINGS: CTA CHEST FINDINGS Cardiovascular: Satisfactory opacification of the pulmonary arteries to the segmental level. No evidence of pulmonary embolism. Normal heart size. No pericardial effusion. No thoracic aortic aneurysm or dissection. Mediastinum/Nodes: No enlarged mediastinal, hilar, or axillary lymph nodes. Thyroid gland, trachea, and esophagus demonstrate no significant findings. Lungs/Pleura: Lungs are clear. No pleural effusion or pneumothorax. Musculoskeletal: No chest wall abnormality. No acute or significant osseous findings. Review of the MIP images confirms the above findings. CT ABDOMEN AND PELVIS FINDINGS Hepatobiliary: Tiny subcentimeter low-density lesion in the peripheral right hepatic lobe, too small to characterize. No other focal liver abnormality. The gallbladder is unremarkable. No biliary dilatation. Pancreas: Ill-defined inferior pancreatic head and uncinate process with surrounding inflammatory changes (series 2, image 39). No ductal dilatation. Spleen: Normal in size without focal abnormality. Adrenals/Urinary Tract: Adrenal glands are unremarkable. Kidneys are normal, without renal calculi, focal lesion, or hydronephrosis. Partially duplicated right renal collecting system. Bladder is unremarkable. Stomach/Bowel: Stomach is within normal limits. Appendix appears normal. No evidence of bowel wall thickening, distention, or inflammatory changes. Vascular/Lymphatic: Aortic atherosclerosis. No enlarged abdominal or  pelvic lymph nodes. Reproductive: Prostate is unremarkable. Other: No abdominal wall hernia or abnormality. No abdominopelvic ascites. No pneumoperitoneum. Musculoskeletal: No acute or significant osseous findings. Review of the MIP images confirms the above findings. IMPRESSION: CT chest: 1. No evidence of pulmonary embolism. No acute intrathoracic process. CT abdomen pelvis: 1. Ill-defined inferior pancreatic head and uncinate process with surrounding inflammatory changes, suggestive of acute pancreatitis. The differential diagnosis also includes duodenitis, although there is no overt duodenal wall thickening. 2. Aortic Atherosclerosis (ICD10-I70.0). Electronically Signed   By: Obie Dredge M.D.   On: 08/13/2020 10:18    ____________________________________________   PROCEDURES  Procedure(s) performed (including Critical Care):  .Critical Care Performed by: Concha Se, MD Authorized by: Concha Se, MD   Critical care provider statement:    Critical care time (minutes):  45   Critical care was necessary to treat or prevent imminent or life-threatening deterioration of the following conditions:  Cardiac failure   Critical care was time spent personally by me on the following activities:  Discussions with consultants, evaluation of patient's response to treatment, examination of patient, ordering and performing treatments and interventions, ordering and review of laboratory studies, ordering and review of radiographic studies, pulse oximetry, re-evaluation of patient's condition, obtaining history from patient or surrogate and review of old charts .1-3 Lead EKG Interpretation Performed by: Concha Se, MD Authorized by: Concha Se, MD     Interpretation: abnormal     ECG rate:  180s    ECG rate assessment: tachycardic     Rhythm: atrial fibrillation     Ectopy: none     Conduction: normal  ____________________________________________   INITIAL IMPRESSION /  ASSESSMENT AND PLAN / ED COURSE  Herbert Daniel was evaluated in Emergency Department on 08/13/2020 for the symptoms described in the history of present illness. He was evaluated in the context of the global COVID-19 pandemic, which necessitated consideration that the patient might be at risk for infection with the SARS-CoV-2 virus that causes COVID-19. Institutional protocols and algorithms that pertain to the evaluation of patients at risk for COVID-19 are in a state of rapid change based on information released by regulatory bodies including the CDC and federal and state organizations. These policies and algorithms were followed during the patient's care in the ED.    Patient comes in with new atrial fibrillation with RVR and abdominal pain and shortness of breath.  Will get CT scan to evaluate for PE, pneumonia, CT abdomen to evaluate for acute abdominal pathology such as diverticulitis, perforated appendicitis, gallbladder pathology.  We will give patient fluids, IV Dilaudid IV Zofran.  We will trial a small dose of IV diltiazem Suspect that this could be infectious or pain driven.  Initially patient was not tachycardic when he checked into the emergency room but was in the waiting room for an hour and is now tachycardic.  Work-up is otherwise reassuring except for CT scan does show pancreatitis.  Patient's white count is elevated and patient is in new A. fib with RVR but otherwise he is afebrile with normal blood pressures.  CT imaging without evidence of acute infection therefore do not feel like patient is septic at this time.  Suspect tachycardia secondary to the pain   Work-up is reassuring although CT scan does show concerns for pancreatitis although lipase is normal.  Also possible duodenitis so we will start on PPI.  Patient's heart rate came down slightly with the diltiazem but had to give a second dose and will need to be started on a dill drip.  Discussed with patient needed admission due  to his being A. fib with RVR.  Patient expressed understanding felt comfortable with this plan         ____________________________________________   FINAL CLINICAL IMPRESSION(S) / ED DIAGNOSES   Final diagnoses:  Atrial fibrillation with rapid ventricular response (HCC)  Duodenitis  Alcohol-induced acute pancreatitis, unspecified complication status      MEDICATIONS GIVEN DURING THIS VISIT:  Medications  diltiazem (CARDIZEM) 125 mg in dextrose 5% 125 mL (1 mg/mL) infusion (5 mg/hr Intravenous New Bag/Given 08/13/20 1045)  sodium chloride 0.9 % bolus 1,000 mL (1,000 mLs Intravenous Bolus 08/13/20 0924)  ondansetron (ZOFRAN) injection 4 mg (4 mg Intravenous Given 08/13/20 0926)  HYDROmorphone (DILAUDID) injection 0.5 mg (0.5 mg Intravenous Given 08/13/20 0928)  diltiazem (CARDIZEM) injection 15 mg (15 mg Intravenous Given 08/13/20 0921)  iohexol (OMNIPAQUE) 350 MG/ML injection 100 mL (100 mLs Intravenous Contrast Given 08/13/20 0941)  diltiazem (CARDIZEM) injection 25 mg (25 mg Intravenous Given 08/13/20 1010)  sodium chloride 0.9 % bolus 1,000 mL (1,000 mLs Intravenous Bolus 08/13/20 1032)  pantoprazole (PROTONIX) injection 40 mg (40 mg Intravenous Given 08/13/20 1040)     ED Discharge Orders    None       Note:  This document was prepared using Dragon voice recognition software and may include unintentional dictation errors.   Concha Se, MD 08/13/20 1121

## 2020-08-13 NOTE — ED Notes (Signed)
Pt complaining headache, pain 6 of 10, and pain in the RUQ

## 2020-08-14 ENCOUNTER — Encounter: Payer: Self-pay | Admitting: Family Medicine

## 2020-08-14 DIAGNOSIS — I10 Essential (primary) hypertension: Secondary | ICD-10-CM

## 2020-08-14 DIAGNOSIS — K852 Alcohol induced acute pancreatitis without necrosis or infection: Secondary | ICD-10-CM | POA: Diagnosis present

## 2020-08-14 LAB — CBC
HCT: 43.7 % (ref 39.0–52.0)
Hemoglobin: 14.9 g/dL (ref 13.0–17.0)
MCH: 31.6 pg (ref 26.0–34.0)
MCHC: 34.1 g/dL (ref 30.0–36.0)
MCV: 92.8 fL (ref 80.0–100.0)
Platelets: 191 10*3/uL (ref 150–400)
RBC: 4.71 MIL/uL (ref 4.22–5.81)
RDW: 12.2 % (ref 11.5–15.5)
WBC: 13.2 10*3/uL — ABNORMAL HIGH (ref 4.0–10.5)
nRBC: 0 % (ref 0.0–0.2)

## 2020-08-14 LAB — LIPID PANEL
Cholesterol: 191 mg/dL (ref 0–200)
HDL: 36 mg/dL — ABNORMAL LOW (ref >40)
LDL Cholesterol: 112 mg/dL — ABNORMAL HIGH (ref 0–99)
Total CHOL/HDL Ratio: 5.3 RATIO
Triglycerides: 213 mg/dL — ABNORMAL HIGH (ref <150)
VLDL: 43 mg/dL — ABNORMAL HIGH (ref 0–40)

## 2020-08-14 LAB — BASIC METABOLIC PANEL
Anion gap: 8 (ref 5–15)
BUN: 11 mg/dL (ref 6–20)
CO2: 28 mmol/L (ref 22–32)
Calcium: 8.8 mg/dL — ABNORMAL LOW (ref 8.9–10.3)
Chloride: 101 mmol/L (ref 98–111)
Creatinine, Ser: 0.88 mg/dL (ref 0.61–1.24)
GFR, Estimated: >60 mL/min (ref >60)
Glucose, Bld: 109 mg/dL — ABNORMAL HIGH (ref 70–99)
Potassium: 4.1 mmol/L (ref 3.5–5.1)
Sodium: 137 mmol/L (ref 135–145)

## 2020-08-14 LAB — TROPONIN I (HIGH SENSITIVITY)
Troponin I (High Sensitivity): 16 ng/L (ref <18)
Troponin I (High Sensitivity): 13 ng/L (ref <18)

## 2020-08-14 LAB — HEMOGLOBIN A1C
Hgb A1c MFr Bld: 5.8 % — ABNORMAL HIGH (ref 4.8–5.6)
Mean Plasma Glucose: 119.76 mg/dL

## 2020-08-14 LAB — HEPARIN LEVEL (UNFRACTIONATED)
Heparin Unfractionated: 0.26 IU/mL — ABNORMAL LOW (ref 0.30–0.70)
Heparin Unfractionated: 0.35 IU/mL (ref 0.30–0.70)
Heparin Unfractionated: 0.33 IU/mL (ref 0.30–0.70)

## 2020-08-14 LAB — HIV ANTIBODY (ROUTINE TESTING W REFLEX): HIV Screen 4th Generation wRfx: NONREACTIVE

## 2020-08-14 MED ORDER — HEPARIN BOLUS VIA INFUSION
3000.0000 [IU] | Freq: Once | INTRAVENOUS | Status: AC
  Administered 2020-08-14: 3000 [IU] via INTRAVENOUS
  Filled 2020-08-14: qty 3000

## 2020-08-14 MED ORDER — DILTIAZEM HCL ER COATED BEADS 120 MG PO CP24
240.0000 mg | ORAL_CAPSULE | Freq: Every day | ORAL | Status: DC
  Administered 2020-08-14 – 2020-08-15 (×2): 240 mg via ORAL
  Filled 2020-08-14 (×2): qty 2

## 2020-08-14 MED ORDER — LOSARTAN POTASSIUM 25 MG PO TABS
25.0000 mg | ORAL_TABLET | Freq: Every day | ORAL | Status: DC
  Administered 2020-08-14: 25 mg via ORAL
  Filled 2020-08-14: qty 1

## 2020-08-14 MED ORDER — HEPARIN BOLUS VIA INFUSION
1500.0000 [IU] | Freq: Once | INTRAVENOUS | Status: AC
  Administered 2020-08-14: 1500 [IU] via INTRAVENOUS
  Filled 2020-08-14: qty 1500

## 2020-08-14 NOTE — Consult Note (Signed)
ANTICOAGULATION CONSULT NOTE - Initial Consult  Pharmacy Consult for heparin infusion Indication: atrial fibrillation  No Known Allergies  Patient Measurements: Height: 5\' 10"  (177.8 cm) Weight: 117.9 kg (260 lb) IBW/kg (Calculated) : 73 Heparin Dosing Weight: 99.2 kg   Vital Signs: Temp: 98.3 F (36.8 C) (04/29 2300) Temp Source: Oral (04/29 2300) BP: 156/74 (04/29 2300) Pulse Rate: 72 (04/29 2300)  Labs: Recent Labs    08/13/20 0837 08/13/20 0914 08/13/20 1159 08/13/20 1717 08/13/20 2306  HGB 17.9*  --   --   --   --   HCT 51.2  --   --   --   --   PLT 261  --   --   --   --   APTT  --   --   --  34  --   LABPROT  --   --   --  14.7  --   INR  --   --   --  1.2  --   HEPARINUNFRC  --   --   --   --  0.18*  CREATININE 0.80  --   --   --   --   TROPONINIHS  --  15 19*  --   --     Estimated Creatinine Clearance: 132.7 mL/min (by C-G formula based on SCr of 0.8 mg/dL).   Medical History: Past Medical History:  Diagnosis Date  . ETOH abuse   . Tobacco abuse     Medications:  No prior AC noted  Assessment: 55 y.o. male with medical history significant for alcohol abuse presented to ED with new onset Afib w/ RVR. Pharmacy has been consulted for initiationan and management of heparin infusion for A fib.   Heparin Dosing Weight: 99.2 kg  Baseline CBC reviewed. PT/INR, aPTT ordered   Goal of Therapy:  Heparin level 0.3-0.7 units/ml Monitor platelets by anticoagulation protocol: Yes   Plan:  4/29:  HL @ 2306 = 0.18 Will order Heparin 3000 units IV X 1 bolus and increase drip rate to 1900 units/hr.  Will recheck HL 6 hrs after rate change.   Maher Shon D, PharmD 08/14/2020,12:22 AM

## 2020-08-14 NOTE — Progress Notes (Signed)
Progress Note  Patient Name: Herbert Daniel Date of Encounter: 08/14/2020  Primary Cardiologist: Julien Nordmann, MD  Subjective   Feels better. Abd pain down to 4/10.  Maintaining sinus rhythm.  On further discussion today, he admits to h/o untreated HTN and prior dx of OSA, though he hasn't used CPAP in at least 7 years - didn't tolerate.  Inpatient Medications    Scheduled Meds: . diltiazem  30 mg Oral Q6H  . pantoprazole (PROTONIX) IV  40 mg Intravenous Q12H   Continuous Infusions: . sodium chloride 100 mL/hr at 08/13/20 2311  . diltiazem (CARDIZEM) infusion Stopped (08/13/20 1308)  . heparin 2,100 Units/hr (08/14/20 0919)   PRN Meds: acetaminophen **OR** acetaminophen, LORazepam, morphine injection, ondansetron **OR** ondansetron (ZOFRAN) IV, traZODone   Vital Signs    Vitals:   08/13/20 2000 08/13/20 2300 08/14/20 0421 08/14/20 0932  BP: (!) 144/66 (!) 156/74 (!) 171/84 (!) 168/87  Pulse: 80 72 88 71  Resp: 18 18 18 18   Temp:  98.3 F (36.8 C) 98.5 F (36.9 C) 98.4 F (36.9 C)  TempSrc:  Oral Oral Oral  SpO2: 94% 96% 94% 94%  Weight:  117.2 kg    Height:        Intake/Output Summary (Last 24 hours) at 08/14/2020 0953 Last data filed at 08/14/2020 0935 Gross per 24 hour  Intake 1733.98 ml  Output 2240 ml  Net -506.02 ml   Filed Weights   08/13/20 0832 08/13/20 2300  Weight: 117.9 kg 117.2 kg    Physical Exam   GEN: Well nourished, well developed, in no acute distress.  HEENT: Grossly normal.  Neck: Supple, no JVD, carotid bruits, or masses. Cardiac: RRR, no murmurs, rubs, or gallops. No clubbing, cyanosis, edema.  Radials 2+, DP/PT 2+ and equal bilaterally.  Respiratory:  Respirations regular and unlabored, clear to auscultation bilaterally. GI: Soft, mild, diffuse tenderness, nondistended, BS + x 4. MS: no deformity or atrophy. Skin: warm and dry, no rash. Neuro:  Strength and sensation are intact. Psych: AAOx3.  Normal affect.  Labs     Chemistry Recent Labs  Lab 08/13/20 0837 08/14/20 0437  NA 134* 137  K 3.9 4.1  CL 97* 101  CO2 27 28  GLUCOSE 138* 109*  BUN 11 11  CREATININE 0.80 0.88  CALCIUM 9.6 8.8*  PROT 8.2*  --   ALBUMIN 4.2  --   AST 25  --   ALT 26  --   ALKPHOS 71  --   BILITOT 1.3*  --   GFRNONAA >60 >60  ANIONGAP 10 8     Hematology Recent Labs  Lab 08/13/20 0837 08/14/20 0437  WBC 17.6* 13.2*  RBC 5.73 4.71  HGB 17.9* 14.9  HCT 51.2 43.7  MCV 89.4 92.8  MCH 31.2 31.6  MCHC 35.0 34.1  RDW 11.8 12.2  PLT 261 191    Cardiac Enzymes  Recent Labs  Lab 08/13/20 0914 08/13/20 1159 08/14/20 0437 08/14/20 0657  TROPONINIHS 15 19* 16 13      BNP Recent Labs  Lab 08/13/20 0837  BNP 122.4*     Radiology    CT Angio Chest PE W and/or Wo Contrast  Result Date: 08/13/2020 CLINICAL DATA:  Abdominal and back pain for the past week. EXAM: CT ANGIOGRAPHY CHEST CT ABDOMEN AND PELVIS WITH CONTRAST TECHNIQUE: Multidetector CT imaging of the chest was performed using the standard protocol during bolus administration of intravenous contrast. Multiplanar CT image reconstructions and MIPs were obtained to  evaluate the vascular anatomy. Multidetector CT imaging of the abdomen and pelvis was performed using the standard protocol during bolus administration of intravenous contrast. CONTRAST:  OMNIPAQUE IOHEXOL 350 MG/ML SOLN COMPARISON:  None. FINDINGS: CTA CHEST FINDINGS Cardiovascular: Satisfactory opacification of the pulmonary arteries to the segmental level. No evidence of pulmonary embolism. Normal heart size. No pericardial effusion. No thoracic aortic aneurysm or dissection. Mediastinum/Nodes: No enlarged mediastinal, hilar, or axillary lymph nodes. Thyroid gland, trachea, and esophagus demonstrate no significant findings. Lungs/Pleura: Lungs are clear. No pleural effusion or pneumothorax. Musculoskeletal: No chest wall abnormality. No acute or significant osseous findings. Review of the  MIP images confirms the above findings. CT ABDOMEN AND PELVIS FINDINGS Hepatobiliary: Tiny subcentimeter low-density lesion in the peripheral right hepatic lobe, too small to characterize. No other focal liver abnormality. The gallbladder is unremarkable. No biliary dilatation. Pancreas: Ill-defined inferior pancreatic head and uncinate process with surrounding inflammatory changes (series 2, image 39). No ductal dilatation. Spleen: Normal in size without focal abnormality. Adrenals/Urinary Tract: Adrenal glands are unremarkable. Kidneys are normal, without renal calculi, focal lesion, or hydronephrosis. Partially duplicated right renal collecting system. Bladder is unremarkable. Stomach/Bowel: Stomach is within normal limits. Appendix appears normal. No evidence of bowel wall thickening, distention, or inflammatory changes. Vascular/Lymphatic: Aortic atherosclerosis. No enlarged abdominal or pelvic lymph nodes. Reproductive: Prostate is unremarkable. Other: No abdominal wall hernia or abnormality. No abdominopelvic ascites. No pneumoperitoneum. Musculoskeletal: No acute or significant osseous findings. Review of the MIP images confirms the above findings. IMPRESSION: CT chest: 1. No evidence of pulmonary embolism. No acute intrathoracic process. CT abdomen pelvis: 1. Ill-defined inferior pancreatic head and uncinate process with surrounding inflammatory changes, suggestive of acute pancreatitis. The differential diagnosis also includes duodenitis, although there is no overt duodenal wall thickening. 2. Aortic Atherosclerosis (ICD10-I70.0). Electronically Signed   By: Obie Dredge M.D.   On: 08/13/2020 10:18   CT ABDOMEN PELVIS W CONTRAST  Result Date: 08/13/2020 CLINICAL DATA:  Abdominal and back pain for the past week. EXAM: CT ANGIOGRAPHY CHEST CT ABDOMEN AND PELVIS WITH CONTRAST TECHNIQUE: Multidetector CT imaging of the chest was performed using the standard protocol during bolus administration of  intravenous contrast. Multiplanar CT image reconstructions and MIPs were obtained to evaluate the vascular anatomy. Multidetector CT imaging of the abdomen and pelvis was performed using the standard protocol during bolus administration of intravenous contrast. CONTRAST:  OMNIPAQUE IOHEXOL 350 MG/ML SOLN COMPARISON:  None. FINDINGS: CTA CHEST FINDINGS Cardiovascular: Satisfactory opacification of the pulmonary arteries to the segmental level. No evidence of pulmonary embolism. Normal heart size. No pericardial effusion. No thoracic aortic aneurysm or dissection. Mediastinum/Nodes: No enlarged mediastinal, hilar, or axillary lymph nodes. Thyroid gland, trachea, and esophagus demonstrate no significant findings. Lungs/Pleura: Lungs are clear. No pleural effusion or pneumothorax. Musculoskeletal: No chest wall abnormality. No acute or significant osseous findings. Review of the MIP images confirms the above findings. CT ABDOMEN AND PELVIS FINDINGS Hepatobiliary: Tiny subcentimeter low-density lesion in the peripheral right hepatic lobe, too small to characterize. No other focal liver abnormality. The gallbladder is unremarkable. No biliary dilatation. Pancreas: Ill-defined inferior pancreatic head and uncinate process with surrounding inflammatory changes (series 2, image 39). No ductal dilatation. Spleen: Normal in size without focal abnormality. Adrenals/Urinary Tract: Adrenal glands are unremarkable. Kidneys are normal, without renal calculi, focal lesion, or hydronephrosis. Partially duplicated right renal collecting system. Bladder is unremarkable. Stomach/Bowel: Stomach is within normal limits. Appendix appears normal. No evidence of bowel wall thickening, distention, or inflammatory changes.  Vascular/Lymphatic: Aortic atherosclerosis. No enlarged abdominal or pelvic lymph nodes. Reproductive: Prostate is unremarkable. Other: No abdominal wall hernia or abnormality. No abdominopelvic ascites. No  pneumoperitoneum. Musculoskeletal: No acute or significant osseous findings. Review of the MIP images confirms the above findings. IMPRESSION: CT chest: 1. No evidence of pulmonary embolism. No acute intrathoracic process. CT abdomen pelvis: 1. Ill-defined inferior pancreatic head and uncinate process with surrounding inflammatory changes, suggestive of acute pancreatitis. The differential diagnosis also includes duodenitis, although there is no overt duodenal wall thickening. 2. Aortic Atherosclerosis (ICD10-I70.0). Electronically Signed   By: Obie Dredge M.D.   On: 08/13/2020 10:18    Telemetry    RSR - sinus tachycardia.  Trending mostly in 78s - Personally Reviewed  Cardiac Studies   2D Echocardiogram pending  Patient Profile    55 y.o. male with a history of etoh and tobacco abuse who was admitted 4/29 w/ a 1 wk h/o abd pain and dyspnea, and found to have acute pancreatitis and Afib RVR.  He converted to sinus rhythm on PO dilt in the ED.  Assessment & Plan    1. Afib w/ RVR:  Pt presented 4/29 w/ a 1 wk h/o abd pain and dyspnea w/ acute pancreatitis.  He was found to be in afib w/ RVR, initially @ 182.  He responded well to IV dilt and subsequently converted to sinus rhythm while in ED.  Lytes/TSH wnl.  He has been hypertensive (not prev dx).  In that setting, CHA2DS2VASc @ least 1 and possibly higher given mild hyperglycemia and Ao atherosclerosis noted on CT.  He is currently on heparin and oral dilt.  Will consolidate and titrate dilt to 240mg  daily. If no plans for invasive procedures, can change heparin to eliquis, but will wait until after Echo, in case LV dysfxn present and cath appropriate.  2.  Acute pancreatitis:  Mgmt per IM.  Pain improving.  3.  Tob/ETOH abuse:  No drinking in a week.  Complete cessation encouraged.  4.  Elevated HsTrop:  Max of 19 and nl since.  Awaiting echo.  Will plan on outpt anatomic/ischemic eval.  5.  Essential HTN:  Says he is aware that bp  high previously but was never treated.  Cont ccb.  6.  OSA:  Prev dx about 8 years ago.  Didn't tolerate CPAP.  We discussed importance of retesting and compliance as it does play a role in Afib.  Await echo for pulm pressures.  7.  Morbid obesity:  He has been on keto diet and has lost weight. Discussed importance of active lifestyle to maintain healthy weight.  9.  Hyperglycemia:  No prior h/o diabetes.  A1c pending.  10.  Ao atherosclerosis:  F/u lipids.  Signed, , NP  08/14/2020, 9:53 AM    For questions or updates, please contact   Please consult [email or web address] for contact info under Cardiology/STEMI.

## 2020-08-14 NOTE — Progress Notes (Addendum)
Progress Note    [name]  BJS:[id number] DOB: [birth year]  DOA: 08/13/2020 PCP: Pcp, No      Brief Narrative:    Medical records reviewed and are as summarized below:  Herbert Daniel is a 55 y.o. male       Assessment/Plan:   Active Problems:   Atrial fibrillation with RVR (HCC)   Primary hypertension    Body mass index is 37.08 kg/m.  (Obesity)   Paroxysmal atrial fibrillation with RVR: He is off of IV Cardizem infusion.  Continue IV heparin infusion and monitor heparin level per protocol.  Continue oral Cardizem.  2D echo is pending.  Acute pancreatitis: Lipase normal but inflammatory changes noted in the pancreas on CT scan of the abdomen.  Advanced diet to soft diet.  Discontinue IV fluids.  Discontinue IV Protonix.  Alcohol tobacco use disorder: Advised to stop drinking alcohol and stop smoking cigarettes.  He said he quit drinking alcohol about 10 days ago.  Hypertension: Continue antihypertensives.  Hemoglobin A1c is 5.8 consistent with prediabetes.  Weight loss advised.  Diet Order            DIET SOFT Room service appropriate? Yes; Fluid consistency: Thin  Diet effective now                    Consultants:  Cardiologist  Procedures:  None    Medications:   . diltiazem  240 mg Oral Daily  . losartan  25 mg Oral Daily  . pantoprazole (PROTONIX) IV  40 mg Intravenous Q12H   Continuous Infusions: . sodium chloride 100 mL/hr at 08/14/20 1048  . heparin 2,100 Units/hr (08/14/20 1720)     Anti-infectives (From admission, onward)   None             Family Communication/Anticipated D/C date and plan/Code Status   DVT prophylaxis:      Code Status: Full Code  Family Communication: None Disposition Plan:    Status is: Inpatient  Remains inpatient appropriate because:IV treatments appropriate due to intensity of illness or inability to take PO and Inpatient level of care appropriate due to severity of  illness   Dispo: The patient is from: Home              Anticipated d/c is to: Home              Patient currently is not medically stable to d/c.   Difficult to place patient No           Subjective:   Interval events noted.  Breathing is a little better.  Abdominal pain is improving.  Objective:    Vitals:   08/14/20 0421 08/14/20 0932 08/14/20 1137 08/14/20 1546  BP: (!) 171/84 (!) 168/87 (!) 165/93 (!) 170/92  Pulse: 88 71 67 75  Resp: 18 18 16 16   Temp: 98.5 F (36.9 C) 98.4 F (36.9 C) 97.8 F (36.6 C) 98 F (36.7 C)  TempSrc: Oral Oral Oral   SpO2: 94% 94% 96% 94%  Weight:      Height:       No data found.   Intake/Output Summary (Last 24 hours) at 08/14/2020 1725 Last data filed at 08/14/2020 1544 Gross per 24 hour  Intake 2093.98 ml  Output 4215 ml  Net -2121.02 ml   Filed Weights   08/13/20 0832 08/13/20 2300  Weight: 117.9 kg 117.2 kg    Exam:  GEN: NAD SKIN: No rash EYES:  EOMI ENT: MMM CV: RRR PULM: CTA B ABD: soft, ND, NT, +BS CNS: AAO x 3, non focal EXT: No edema or tenderness        Data Reviewed:   I have personally reviewed following labs and imaging studies:  Labs: Labs show the following:   Basic Metabolic Panel: Recent Labs  Lab 08/13/20 0837 08/14/20 0437  NA 134* 137  K 3.9 4.1  CL 97* 101  CO2 27 28  GLUCOSE 138* 109*  BUN 11 11  CREATININE 0.80 0.88  CALCIUM 9.6 8.8*   GFR Estimated Creatinine Clearance: 120.2 mL/min (by C-G formula based on SCr of 0.88 mg/dL). Liver Function Tests: Recent Labs  Lab 08/13/20 0837  AST 25  ALT 26  ALKPHOS 71  BILITOT 1.3*  PROT 8.2*  ALBUMIN 4.2   Recent Labs  Lab 08/13/20 0837  LIPASE 38   No results for input(s): AMMONIA in the last 168 hours. Coagulation profile Recent Labs  Lab 08/13/20 1717  INR 1.2    CBC: Recent Labs  Lab 08/13/20 0837 08/14/20 0437  WBC 17.6* 13.2*  HGB 17.9* 14.9  HCT 51.2 43.7  MCV 89.4 92.8  PLT 261 191    Cardiac Enzymes: No results for input(s): CKTOTAL, CKMB, CKMBINDEX, TROPONINI in the last 168 hours. BNP (last 3 results) No results for input(s): PROBNP in the last 8760 hours. CBG: No results for input(s): GLUCAP in the last 168 hours. D-Dimer: No results for input(s): DDIMER in the last 72 hours. Hgb A1c: Recent Labs    08/14/20 0437  HGBA1C 5.8*   Lipid Profile: No results for input(s): CHOL, HDL, LDLCALC, TRIG, CHOLHDL, LDLDIRECT in the last 72 hours. Thyroid function studies: Recent Labs    08/13/20 0914  TSH 2.647   Anemia work up: No results for input(s): VITAMINB12, FOLATE, FERRITIN, TIBC, IRON, RETICCTPCT in the last 72 hours. Sepsis Labs: Recent Labs  Lab 08/13/20 0837 08/13/20 0914 08/14/20 0437  WBC 17.6*  --  13.2*  LATICACIDVEN  --  1.4  --     Microbiology Recent Results (from the past 240 hour(s))  Resp Panel by RT-PCR (Flu A&B, Covid) Nasopharyngeal Swab     Status: None   Collection Time: 08/13/20  9:14 AM   Specimen: Nasopharyngeal Swab; Nasopharyngeal(NP) swabs in vial transport medium  Result Value Ref Range Status   SARS Coronavirus 2 by RT PCR NEGATIVE NEGATIVE Final    Comment: (NOTE) SARS-CoV-2 target nucleic acids are NOT DETECTED.  The SARS-CoV-2 RNA is generally detectable in upper respiratory specimens during the acute phase of infection. The lowest concentration of SARS-CoV-2 viral copies this assay can detect is 138 copies/mL. A negative result does not preclude SARS-Cov-2 infection and should not be used as the sole basis for treatment or other patient management decisions. A negative result may occur with  improper specimen collection/handling, submission of specimen other than nasopharyngeal swab, presence of viral mutation(s) within the areas targeted by this assay, and inadequate number of viral copies(<138 copies/mL). A negative result must be combined with clinical observations, patient history, and  epidemiological information. The expected result is Negative.  Fact Sheet for Patients:  BloggerCourse.com  Fact Sheet for Healthcare Providers:  SeriousBroker.it  This test is no t yet approved or cleared by the [place] FDA and  has been authorized for detection and/or diagnosis of SARS-CoV-2 by FDA under an Emergency Use Authorization (EUA). This EUA will remain  in effect (meaning this test can be used) for the  duration of the COVID-19 declaration under Section 564(b)(1) of the Act, 21 U.S.C.section 360bbb-3(b)(1), unless the authorization is terminated  or revoked sooner.       Influenza A by PCR NEGATIVE NEGATIVE Final   Influenza B by PCR NEGATIVE NEGATIVE Final    Comment: (NOTE) The Xpert Xpress SARS-CoV-2/FLU/RSV plus assay is intended as an aid in the diagnosis of influenza from Nasopharyngeal swab specimens and should not be used as a sole basis for treatment. Nasal washings and aspirates are unacceptable for Xpert Xpress SARS-CoV-2/FLU/RSV testing.  Fact Sheet for Patients: BloggerCourse.com  Fact Sheet for Healthcare Providers: SeriousBroker.it  This test is not yet approved or cleared by the [place] FDA and has been authorized for detection and/or diagnosis of SARS-CoV-2 by FDA under an Emergency Use Authorization (EUA). This EUA will remain in effect (meaning this test can be used) for the duration of the COVID-19 declaration under Section 564(b)(1) of the Act, 21 U.S.C. section 360bbb-3(b)(1), unless the authorization is terminated or revoked.  Performed at [facility], [address] [place], [place] [address]   Blood culture (routine x 2)     Status: None (Preliminary result)   Collection Time: 08/13/20  9:14 AM   Specimen: BLOOD  Result Value Ref Range Status   Specimen Description BLOOD RIGHT ANTECUBITAL  Final   Special  Requests   Final    BOTTLES DRAWN AEROBIC AND ANAEROBIC Blood Culture adequate volume   Culture   Final    NO GROWTH 1 DAY Performed at [facility], [address] [place], [place] [address]    Report Status PENDING  Incomplete  Blood culture (routine x 2)     Status: None (Preliminary result)   Collection Time: 08/13/20  9:14 AM   Specimen: BLOOD  Result Value Ref Range Status   Specimen Description BLOOD BLOOD LEFT WRIST  Final   Special Requests   Final    BOTTLES DRAWN AEROBIC AND ANAEROBIC Blood Culture adequate volume   Culture   Final    NO GROWTH 1 DAY Performed at [facility], [address], [place], [place] [address]    Report Status PENDING  Incomplete    Procedures and diagnostic studies:  CT Angio Chest PE W and/or Wo Contrast  Result Date: 08/13/2020 CLINICAL DATA:  Abdominal and back pain for the past week. EXAM: CT ANGIOGRAPHY CHEST CT ABDOMEN AND PELVIS WITH CONTRAST TECHNIQUE: Multidetector CT imaging of the chest was performed using the standard protocol during bolus administration of intravenous contrast. Multiplanar CT image reconstructions and MIPs were obtained to evaluate the vascular anatomy. Multidetector CT imaging of the abdomen and pelvis was performed using the standard protocol during bolus administration of intravenous contrast. CONTRAST:  OMNIPAQUE IOHEXOL 350 MG/ML SOLN COMPARISON:  None. FINDINGS: CTA CHEST FINDINGS Cardiovascular: Satisfactory opacification of the pulmonary arteries to the segmental level. No evidence of pulmonary embolism. Normal heart size. No pericardial effusion. No thoracic aortic aneurysm or dissection. Mediastinum/Nodes: No enlarged mediastinal, hilar, or axillary lymph nodes. Thyroid gland, trachea, and esophagus demonstrate no significant findings. Lungs/Pleura: Lungs are clear. No pleural effusion or pneumothorax. Musculoskeletal: No chest wall abnormality. No acute or significant osseous findings.  Review of the MIP images confirms the above findings. CT ABDOMEN AND PELVIS FINDINGS Hepatobiliary: Tiny subcentimeter low-density lesion in the peripheral right hepatic lobe, too small to characterize. No other focal liver abnormality. The gallbladder is unremarkable. No biliary dilatation. Pancreas: Ill-defined inferior pancreatic head and uncinate process with  surrounding inflammatory changes (series 2, image 39). No ductal dilatation. Spleen: Normal in size without focal abnormality. Adrenals/Urinary Tract: Adrenal glands are unremarkable. Kidneys are normal, without renal calculi, focal lesion, or hydronephrosis. Partially duplicated right renal collecting system. Bladder is unremarkable. Stomach/Bowel: Stomach is within normal limits. Appendix appears normal. No evidence of bowel wall thickening, distention, or inflammatory changes. Vascular/Lymphatic: Aortic atherosclerosis. No enlarged abdominal or pelvic lymph nodes. Reproductive: Prostate is unremarkable. Other: No abdominal wall hernia or abnormality. No abdominopelvic ascites. No pneumoperitoneum. Musculoskeletal: No acute or significant osseous findings. Review of the MIP images confirms the above findings. IMPRESSION: CT chest: 1. No evidence of pulmonary embolism. No acute intrathoracic process. CT abdomen pelvis: 1. Ill-defined inferior pancreatic head and uncinate process with surrounding inflammatory changes, suggestive of acute pancreatitis. The differential diagnosis also includes duodenitis, although there is no overt duodenal wall thickening. 2. Aortic Atherosclerosis (ICD10-I70.0). Electronically Signed   By: Obie DredgeWilliam T Derry M.D.   On: 08/13/2020 10:18   CT ABDOMEN PELVIS W CONTRAST  Result Date: 08/13/2020 CLINICAL DATA:  Abdominal and back pain for the past week. EXAM: CT ANGIOGRAPHY CHEST CT ABDOMEN AND PELVIS WITH CONTRAST TECHNIQUE: Multidetector CT imaging of the chest was performed using the standard protocol during bolus  administration of intravenous contrast. Multiplanar CT image reconstructions and MIPs were obtained to evaluate the vascular anatomy. Multidetector CT imaging of the abdomen and pelvis was performed using the standard protocol during bolus administration of intravenous contrast. CONTRAST:  100mL OMNIPAQUE IOHEXOL 350 MG/ML SOLN COMPARISON:  None. FINDINGS: CTA CHEST FINDINGS Cardiovascular: Satisfactory opacification of the pulmonary arteries to the segmental level. No evidence of pulmonary embolism. Normal heart size. No pericardial effusion. No thoracic aortic aneurysm or dissection. Mediastinum/Nodes: No enlarged mediastinal, hilar, or axillary lymph nodes. Thyroid gland, trachea, and esophagus demonstrate no significant findings. Lungs/Pleura: Lungs are clear. No pleural effusion or pneumothorax. Musculoskeletal: No chest wall abnormality. No acute or significant osseous findings. Review of the MIP images confirms the above findings. CT ABDOMEN AND PELVIS FINDINGS Hepatobiliary: Tiny subcentimeter low-density lesion in the peripheral right hepatic lobe, too small to characterize. No other focal liver abnormality. The gallbladder is unremarkable. No biliary dilatation. Pancreas: Ill-defined inferior pancreatic head and uncinate process with surrounding inflammatory changes (series 2, image 39). No ductal dilatation. Spleen: Normal in size without focal abnormality. Adrenals/Urinary Tract: Adrenal glands are unremarkable. Kidneys are normal, without renal calculi, focal lesion, or hydronephrosis. Partially duplicated right renal collecting system. Bladder is unremarkable. Stomach/Bowel: Stomach is within normal limits. Appendix appears normal. No evidence of bowel wall thickening, distention, or inflammatory changes. Vascular/Lymphatic: Aortic atherosclerosis. No enlarged abdominal or pelvic lymph nodes. Reproductive: Prostate is unremarkable. Other: No abdominal wall hernia or abnormality. No abdominopelvic  ascites. No pneumoperitoneum. Musculoskeletal: No acute or significant osseous findings. Review of the MIP images confirms the above findings. IMPRESSION: CT chest: 1. No evidence of pulmonary embolism. No acute intrathoracic process. CT abdomen pelvis: 1. Ill-defined inferior pancreatic head and uncinate process with surrounding inflammatory changes, suggestive of acute pancreatitis. The differential diagnosis also includes duodenitis, although there is no overt duodenal wall thickening. 2. Aortic Atherosclerosis (ICD10-I70.0). Electronically Signed   By: Obie DredgeWilliam T Derry M.D.   On: 08/13/2020 10:18               LOS: 1 day   Deneen Slager  [facility]   Pager on [email or web address]. If 7PM-7AM, please contact night-coverage at [email or web address]     08/14/2020, 5:25 PM

## 2020-08-14 NOTE — Consult Note (Signed)
ANTICOAGULATION CONSULT NOTE   Pharmacy Consult for heparin infusion Indication: atrial fibrillation  No Known Allergies  Patient Measurements: Height: 5\' 10"  (177.8 cm) Weight: 117.2 kg (258 lb 6.4 oz) IBW/kg (Calculated) : 73 Heparin Dosing Weight: 99.2 kg   Vital Signs: Temp: 97.9 F (36.6 C) (04/30 1942) Temp Source: Oral (04/30 1942) BP: 166/85 (04/30 2100) Pulse Rate: 76 (04/30 2100)  Labs: Recent Labs    08/13/20 0837 08/13/20 0914 08/13/20 1159 08/13/20 1717 08/13/20 2306 08/14/20 0437 08/14/20 0657 08/14/20 1452 08/14/20 2044  HGB 17.9*  --   --   --   --  14.9  --   --   --   HCT 51.2  --   --   --   --  43.7  --   --   --   PLT 261  --   --   --   --  191  --   --   --   APTT  --   --   --  34  --   --   --   --   --   LABPROT  --   --   --  14.7  --   --   --   --   --   INR  --   --   --  1.2  --   --   --   --   --   HEPARINUNFRC  --   --   --   --    < >  --  0.26* 0.35 0.33  CREATININE 0.80  --   --   --   --  0.88  --   --   --   TROPONINIHS  --    < > 19*  --   --  16 13  --   --    < > = values in this interval not displayed.    Estimated Creatinine Clearance: 120.2 mL/min (by C-G formula based on SCr of 0.88 mg/dL).   Medical History: Past Medical History:  Diagnosis Date  . ETOH abuse   . Tobacco abuse     Medications:  No prior AC noted  Assessment: 55 y.o. male with medical history significant for alcohol abuse presented to ED with new onset Afib w/ RVR. Pharmacy has been consulted for initiationan and management of heparin infusion for A fib.   Heparin Dosing Weight: 99.2 kg  Baseline CBC reviewed. PT/INR, aPTT ordered   4/29:  HL @ 2306 = 0.18     Heparin 3000 units IV X 1 bolus and increase drip rate to 1900 units/hr.  4/30:  HL @ 0657 = 0.26    1500 units IV bolus,increase to 2100 units/hr.  4/30:  HL @ 1452 = 0.36, therapeutic x 1 4/30:  HL @ 2044 = 0.33, therapeutic x 2  Goal of Therapy:  Heparin level 0.3-0.7  units/ml Monitor platelets by anticoagulation protocol: Yes   Plan:  4/30:  HL @ 2044 = 0.33 therapeutic x 2 Will continue Heparin drip at 2100 units/hr.  Will check HL with AM labs Daily CBC while on Heparin drip   2045, PharmD, BCPS 08/14/2020 9:43 PM

## 2020-08-14 NOTE — Consult Note (Signed)
ANTICOAGULATION CONSULT NOTE   Pharmacy Consult for heparin infusion Indication: atrial fibrillation  No Known Allergies  Patient Measurements: Height: 5\' 10"  (177.8 cm) Weight: 117.2 kg (258 lb 6.4 oz) IBW/kg (Calculated) : 73 Heparin Dosing Weight: 99.2 kg   Vital Signs: Temp: 97.8 F (36.6 C) (04/30 1137) Temp Source: Oral (04/30 1137) BP: 165/93 (04/30 1137) Pulse Rate: 67 (04/30 1137)  Labs: Recent Labs    08/13/20 0837 08/13/20 0914 08/13/20 1159 08/13/20 1717 08/13/20 2306 08/14/20 0437 08/14/20 0657 08/14/20 1452  HGB 17.9*  --   --   --   --  14.9  --   --   HCT 51.2  --   --   --   --  43.7  --   --   PLT 261  --   --   --   --  191  --   --   APTT  --   --   --  34  --   --   --   --   LABPROT  --   --   --  14.7  --   --   --   --   INR  --   --   --  1.2  --   --   --   --   HEPARINUNFRC  --   --   --   --  0.18*  --  0.26* 0.35  CREATININE 0.80  --   --   --   --  0.88  --   --   TROPONINIHS  --    < > 19*  --   --  16 13  --    < > = values in this interval not displayed.    Estimated Creatinine Clearance: 120.2 mL/min (by C-G formula based on SCr of 0.88 mg/dL).   Medical History: Past Medical History:  Diagnosis Date  . ETOH abuse   . Tobacco abuse     Medications:  No prior AC noted  Assessment: 55 y.o. male with medical history significant for alcohol abuse presented to ED with new onset Afib w/ RVR. Pharmacy has been consulted for initiationan and management of heparin infusion for A fib.   Heparin Dosing Weight: 99.2 kg  Baseline CBC reviewed. PT/INR, aPTT ordered   4/29:  HL @ 2306 = 0.18     Heparin 3000 units IV X 1 bolus and increase drip rate to 1900 units/hr.  4/30:  HL @ 0657 = 0.26    1500 units IV bolus,increase to 2100 units/hr.   Goal of Therapy:  Heparin level 0.3-0.7 units/ml Monitor platelets by anticoagulation protocol: Yes   Plan:  4/30:  HL @  = 0.35 therapeutic x 1 Will continue Heparin drip at 2100  units/hr.  Will check confirmatory HL 6 hrs   Infant Zink A, PharmD 08/14/2020,3:39 PM

## 2020-08-14 NOTE — Progress Notes (Signed)
Physician notified, patient complains of eipgastric pain radiating to back 7/10, morphine given. BP elevated 171/84.  Dr Para March ordered troponin added to am labs.

## 2020-08-14 NOTE — Consult Note (Signed)
ANTICOAGULATION CONSULT NOTE   Pharmacy Consult for heparin infusion Indication: atrial fibrillation  No Known Allergies  Patient Measurements: Height: 5\' 10"  (177.8 cm) Weight: 117.2 kg (258 lb 6.4 oz) IBW/kg (Calculated) : 73 Heparin Dosing Weight: 99.2 kg   Vital Signs: Temp: 98.5 F (36.9 C) (04/30 0421) Temp Source: Oral (04/30 0421) BP: 171/84 (04/30 0421) Pulse Rate: 88 (04/30 0421)  Labs: Recent Labs    08/13/20 0837 08/13/20 0914 08/13/20 1159 08/13/20 1717 08/13/20 2306 08/14/20 0437 08/14/20 0657  HGB 17.9*  --   --   --   --  14.9  --   HCT 51.2  --   --   --   --  43.7  --   PLT 261  --   --   --   --  191  --   APTT  --   --   --  34  --   --   --   LABPROT  --   --   --  14.7  --   --   --   INR  --   --   --  1.2  --   --   --   HEPARINUNFRC  --   --   --   --  0.18*  --  0.26*  CREATININE 0.80  --   --   --   --  0.88  --   TROPONINIHS  --    < > 19*  --   --  16 13   < > = values in this interval not displayed.    Estimated Creatinine Clearance: 120.2 mL/min (by C-G formula based on SCr of 0.88 mg/dL).   Medical History: Past Medical History:  Diagnosis Date  . ETOH abuse   . Tobacco abuse     Medications:  No prior AC noted  Assessment: 55 y.o. male with medical history significant for alcohol abuse presented to ED with new onset Afib w/ RVR. Pharmacy has been consulted for initiationan and management of heparin infusion for A fib.   Heparin Dosing Weight: 99.2 kg  Baseline CBC reviewed. PT/INR, aPTT ordered   4/29:  HL @ 2306 = 0.18     Heparin 3000 units IV X 1 bolus and increase drip rate to 1900 units/hr.  4/30:  HL @ 0657 = 0.28 Will order Heparin 1500 units IV X 1 bolus and increase drip rate to 2100 units/hr.  Goal of Therapy:  Heparin level 0.3-0.7 units/ml Monitor platelets by anticoagulation protocol: Yes   Plan:  4/30:  HL @ 0657 = 0.28 Will order Heparin 1500 units IV X 1 bolus and increase drip rate to 2100  units/hr.  Will recheck HL 6 hrs after rate change.   Aloha Bartok A, PharmD 08/14/2020,8:27 AM

## 2020-08-15 ENCOUNTER — Inpatient Hospital Stay (HOSPITAL_COMMUNITY)
Admit: 2020-08-15 | Discharge: 2020-08-15 | Disposition: A | Discharge status: Home / Self Care | Payer: BC Managed Care – PPO | Attending: Cardiovascular Disease | Admitting: Cardiovascular Disease

## 2020-08-15 DIAGNOSIS — I4891 Unspecified atrial fibrillation: Secondary | ICD-10-CM

## 2020-08-15 DIAGNOSIS — R7303 Prediabetes: Secondary | ICD-10-CM | POA: Diagnosis present

## 2020-08-15 LAB — CBC
HCT: 42.4 % (ref 39.0–52.0)
Hemoglobin: 14.2 g/dL (ref 13.0–17.0)
MCH: 30.8 pg (ref 26.0–34.0)
MCHC: 33.5 g/dL (ref 30.0–36.0)
MCV: 92 fL (ref 80.0–100.0)
Platelets: 201 10*3/uL (ref 150–400)
RBC: 4.61 MIL/uL (ref 4.22–5.81)
RDW: 11.8 % (ref 11.5–15.5)
WBC: 9.9 10*3/uL (ref 4.0–10.5)
nRBC: 0 % (ref 0.0–0.2)

## 2020-08-15 LAB — ECHOCARDIOGRAM COMPLETE
AR max vel: 3.57 cm2
AV Area VTI: 4.45 cm2
AV Area mean vel: 4.22 cm2
AV Mean grad: 5 mmHg
AV Peak grad: 10.1 mmHg
Ao pk vel: 1.59 m/s
Area-P 1/2: 3.73 cm2
Height: 70 in
S' Lateral: 3.08 cm
Weight: 4134.4 oz

## 2020-08-15 LAB — HEMOGLOBIN A1C
Hgb A1c MFr Bld: 5.7 % — ABNORMAL HIGH (ref 4.8–5.6)
Mean Plasma Glucose: 116.89 mg/dL

## 2020-08-15 LAB — HEPARIN LEVEL (UNFRACTIONATED): Heparin Unfractionated: 0.41 IU/mL (ref 0.30–0.70)

## 2020-08-15 MED ORDER — LOSARTAN POTASSIUM 50 MG PO TABS
50.0000 mg | ORAL_TABLET | Freq: Every day | ORAL | Status: DC
  Administered 2020-08-15: 50 mg via ORAL
  Filled 2020-08-15: qty 1

## 2020-08-15 MED ORDER — APIXABAN 5 MG PO TABS: 5.0000 mg | ORAL_TABLET | Freq: Two times a day (BID) | ORAL | 0 refills | Status: DC

## 2020-08-15 MED ORDER — APIXABAN 5 MG PO TABS
5.0000 mg | ORAL_TABLET | Freq: Two times a day (BID) | ORAL | Status: DC
  Administered 2020-08-15: 5 mg via ORAL
  Filled 2020-08-15: qty 1

## 2020-08-15 MED ORDER — CITALOPRAM HYDROBROMIDE 10 MG PO TABS: 10.0000 mg | ORAL_TABLET | Freq: Every day | ORAL | 0 refills | Status: AC

## 2020-08-15 MED ORDER — LOSARTAN POTASSIUM 50 MG PO TABS: 50.0000 mg | ORAL_TABLET | Freq: Every day | ORAL | 0 refills | Status: DC

## 2020-08-15 MED ORDER — CITALOPRAM HYDROBROMIDE 20 MG PO TABS
10.0000 mg | ORAL_TABLET | Freq: Every day | ORAL | Status: DC
  Administered 2020-08-15: 10 mg via ORAL
  Filled 2020-08-15: qty 1

## 2020-08-15 MED ORDER — TRAZODONE HCL 50 MG PO TABS: 25.0000 mg | ORAL_TABLET | Freq: Every evening | ORAL | 0 refills | Status: DC | PRN

## 2020-08-15 MED ORDER — DILTIAZEM HCL ER COATED BEADS 240 MG PO CP24: 240.0000 mg | ORAL_CAPSULE | Freq: Every day | ORAL | 0 refills | Status: DC

## 2020-08-15 NOTE — Progress Notes (Signed)
Progress Note  Patient Name: Herbert Daniel Date of Encounter: 08/15/2020  Primary Cardiologist: Julien Nordmannimothy Gollan, MD  Subjective   No complaints this morning.  Tolerating diet.  Maintaining sinus rhythm though noted to have a brief episode of A. fib overnight.  Inpatient Medications   Scheduled Meds: . apixaban  5 mg Oral BID  . citalopram  10 mg Oral Daily  . diltiazem  240 mg Oral Daily  . losartan  50 mg Oral Daily   Continuous Infusions: PRN Meds:.acetaminophen **OR** acetaminophen, LORazepam, morphine injection, ondansetron **OR** ondansetron (ZOFRAN) IV, traZODone   Vital Signs    Vitals:   08/14/20 1942 08/14/20 2100 08/15/20 0447 08/15/20 0729  BP: (!) 166/85 (!) 166/85 (!) 148/78 (!) 163/86  Pulse: 76 76 63 67  Resp: 20  18 16   Temp: 97.9 F (36.6 C)  97.8 F (36.6 C) 97.7 F (36.5 C)  TempSrc: Oral  Oral   SpO2: 95%  92% 94%  Weight:      Height:        Intake/Output Summary (Last 24 hours) at 08/15/2020 0754 Last data filed at 08/15/2020 0731 Gross per 24 hour  Intake 960 ml  Output 6575 ml  Net -5615 ml   Filed Weights   08/13/20 0832 08/13/20 2300  Weight: 117.9 kg 117.2 kg    Physical Exam   GEN: Well nourished, well developed, in no acute distress.  HEENT: Grossly normal.  Neck: Supple, no JVD, carotid bruits, or masses. Cardiac: RRR, no murmurs, rubs, or gallops. No clubbing, cyanosis, edema.  Radials 2+, DP/PT 2+ and equal bilaterally.  Respiratory:  Respirations regular and unlabored, clear to auscultation bilaterally. GI: Obese, soft, nontender, nondistended, BS + x 4. MS: no deformity or atrophy. Skin: warm and dry, no rash. Neuro:  Strength and sensation are intact. Psych: AAOx3.  Normal affect.  Labs    Chemistry Recent Labs  Lab 08/13/20 0837 08/14/20 0437  NA 134* 137  K 3.9 4.1  CL 97* 101  CO2 27 28  GLUCOSE 138* 109*  BUN 11 11  CREATININE 0.80 0.88  CALCIUM 9.6 8.8*  PROT 8.2*  --   ALBUMIN 4.2  --   AST 25   --   ALT 26  --   ALKPHOS 71  --   BILITOT 1.3*  --   GFRNONAA >60 >60  ANIONGAP 10 8     Hematology Recent Labs  Lab 08/13/20 0837 08/14/20 0437 08/15/20 0432  WBC 17.6* 13.2* 9.9  RBC 5.73 4.71 4.61  HGB 17.9* 14.9 14.2  HCT 51.2 43.7 42.4  MCV 89.4 92.8 92.0  MCH 31.2 31.6 30.8  MCHC 35.0 34.1 33.5  RDW 11.8 12.2 11.8  PLT 261 191 201    Cardiac Enzymes  Recent Labs  Lab 08/13/20 0914 08/13/20 1159 08/14/20 0437 08/14/20 0657  TROPONINIHS 15 19* 16 13      BNP Recent Labs  Lab 08/13/20 0837  BNP 122.4*     Lipids  Lab Results  Component Value Date   CHOL 191 08/14/2020   HDL 36 (L) 08/14/2020   LDLCALC 112 (H) 08/14/2020   TRIG 213 (H) 08/14/2020   CHOLHDL 5.3 08/14/2020    HbA1c  Lab Results  Component Value Date   HGBA1C 5.8 (H) 08/14/2020    Radiology    CT Angio Chest PE W and/or Wo Contrast  Result Date: 08/13/2020 CLINICAL DATA:  Abdominal and back pain for the past week. EXAM: CT ANGIOGRAPHY CHEST  CT ABDOMEN AND PELVIS WITH CONTRAST TECHNIQUE: Multidetector CT imaging of the chest was performed using the standard protocol during bolus administration of intravenous contrast. Multiplanar CT image reconstructions and MIPs were obtained to evaluate the vascular anatomy. Multidetector CT imaging of the abdomen and pelvis was performed using the standard protocol during bolus administration of intravenous contrast. CONTRAST:  OMNIPAQUE IOHEXOL 350 MG/ML SOLN COMPARISON:  None. FINDINGS: CTA CHEST FINDINGS Cardiovascular: Satisfactory opacification of the pulmonary arteries to the segmental level. No evidence of pulmonary embolism. Normal heart size. No pericardial effusion. No thoracic aortic aneurysm or dissection. Mediastinum/Nodes: No enlarged mediastinal, hilar, or axillary lymph nodes. Thyroid gland, trachea, and esophagus demonstrate no significant findings. Lungs/Pleura: Lungs are clear. No pleural effusion or pneumothorax.  Musculoskeletal: No chest wall abnormality. No acute or significant osseous findings. Review of the MIP images confirms the above findings. CT ABDOMEN AND PELVIS FINDINGS Hepatobiliary: Tiny subcentimeter low-density lesion in the peripheral right hepatic lobe, too small to characterize. No other focal liver abnormality. The gallbladder is unremarkable. No biliary dilatation. Pancreas: Ill-defined inferior pancreatic head and uncinate process with surrounding inflammatory changes (series 2, image 39). No ductal dilatation. Spleen: Normal in size without focal abnormality. Adrenals/Urinary Tract: Adrenal glands are unremarkable. Kidneys are normal, without renal calculi, focal lesion, or hydronephrosis. Partially duplicated right renal collecting system. Bladder is unremarkable. Stomach/Bowel: Stomach is within normal limits. Appendix appears normal. No evidence of bowel wall thickening, distention, or inflammatory changes. Vascular/Lymphatic: Aortic atherosclerosis. No enlarged abdominal or pelvic lymph nodes. Reproductive: Prostate is unremarkable. Other: No abdominal wall hernia or abnormality. No abdominopelvic ascites. No pneumoperitoneum. Musculoskeletal: No acute or significant osseous findings. Review of the MIP images confirms the above findings. IMPRESSION: CT chest: 1. No evidence of pulmonary embolism. No acute intrathoracic process. CT abdomen pelvis: 1. Ill-defined inferior pancreatic head and uncinate process with surrounding inflammatory changes, suggestive of acute pancreatitis. The differential diagnosis also includes duodenitis, although there is no overt duodenal wall thickening. 2. Aortic Atherosclerosis (ICD10-I70.0). Electronically Signed   By: Obie Dredge M.D.   On: 08/13/2020 10:18   CT ABDOMEN PELVIS W CONTRAST  Result Date: 08/13/2020 CLINICAL DATA:  Abdominal and back pain for the past week. EXAM: CT ANGIOGRAPHY CHEST CT ABDOMEN AND PELVIS WITH CONTRAST TECHNIQUE: Multidetector CT  imaging of the chest was performed using the standard protocol during bolus administration of intravenous contrast. Multiplanar CT image reconstructions and MIPs were obtained to evaluate the vascular anatomy. Multidetector CT imaging of the abdomen and pelvis was performed using the standard protocol during bolus administration of intravenous contrast. CONTRAST:  OMNIPAQUE IOHEXOL 350 MG/ML SOLN COMPARISON:  None. FINDINGS: CTA CHEST FINDINGS Cardiovascular: Satisfactory opacification of the pulmonary arteries to the segmental level. No evidence of pulmonary embolism. Normal heart size. No pericardial effusion. No thoracic aortic aneurysm or dissection. Mediastinum/Nodes: No enlarged mediastinal, hilar, or axillary lymph nodes. Thyroid gland, trachea, and esophagus demonstrate no significant findings. Lungs/Pleura: Lungs are clear. No pleural effusion or pneumothorax. Musculoskeletal: No chest wall abnormality. No acute or significant osseous findings. Review of the MIP images confirms the above findings. CT ABDOMEN AND PELVIS FINDINGS Hepatobiliary: Tiny subcentimeter low-density lesion in the peripheral right hepatic lobe, too small to characterize. No other focal liver abnormality. The gallbladder is unremarkable. No biliary dilatation. Pancreas: Ill-defined inferior pancreatic head and uncinate process with surrounding inflammatory changes (series 2, image 39). No ductal dilatation. Spleen: Normal in size without focal abnormality. Adrenals/Urinary Tract: Adrenal glands are unremarkable. Kidneys are normal, without  renal calculi, focal lesion, or hydronephrosis. Partially duplicated right renal collecting system. Bladder is unremarkable. Stomach/Bowel: Stomach is within normal limits. Appendix appears normal. No evidence of bowel wall thickening, distention, or inflammatory changes. Vascular/Lymphatic: Aortic atherosclerosis. No enlarged abdominal or pelvic lymph nodes. Reproductive: Prostate is  unremarkable. Other: No abdominal wall hernia or abnormality. No abdominopelvic ascites. No pneumoperitoneum. Musculoskeletal: No acute or significant osseous findings. Review of the MIP images confirms the above findings. IMPRESSION: CT chest: 1. No evidence of pulmonary embolism. No acute intrathoracic process. CT abdomen pelvis: 1. Ill-defined inferior pancreatic head and uncinate process with surrounding inflammatory changes, suggestive of acute pancreatitis. The differential diagnosis also includes duodenitis, although there is no overt duodenal wall thickening. 2. Aortic Atherosclerosis (ICD10-I70.0). Electronically Signed   By: Obie Dredge M.D.   On: 08/13/2020 10:18    Telemetry    Predominantly sinus rhythm with PVCs in rates in the 60s to 70s.  He had a 3 beat run of nonsustained VT at 3:09 AM followed by about 1 minute of atrial fibrillation, which subsequently broke to sinus rhythm.- Personally Reviewed  Cardiac Studies   2D Echocardiogram - pending  Patient Profile     55 y.o.malewith a history of etoh and tobacco abuse who was admitted 4/29 w/ a 1 wk h/o abd pain and dyspnea, and found to have acute pancreatitis and Afib RVR.  He converted to sinus rhythm on PO dilt in the ED.  Assessment & Plan    1.  Paroxysmal atrial fibrillation: Presented April 29 with a 1 week history of abdominal pain and dyspnea with acute pancreatitis.  Found to be in A. fib with RVR.  He subsequently converted to sinus rhythm on IV and then oral diltiazem.  Diltiazem has been consolidated to CD at 240 mg daily.  For the most part, he has maintained sinus rhythm though overnight had a very brief episode of A. fib following 3 beats of nonsustained VT, lasting 1 minute, resolving spontaneously.  Preliminary echo shows normal EF.  Transition to Eliquis today.  Okay for discharge from our standpoint and we will arrange for outpatient follow-up.  2.  Acute pancreatitis: Management per internal medicine.   Pain improving.  3.  Tobacco/EtOH abuse: Complete cessation advised.  Patient notes that he has been drinking secondary to significant anxiety, especially at night.  Anxiolytics per internal medicine.  Patient or stands that he will need to establish primary care follow-up.  Perhaps case management can assist with this.  4.  Elevated high-sensitivity troponin: Max of 19 and normal since.  Normal LV function by echo.  Consider outpatient stress testing given ongoing risk factors.  5.  Essential hypertension: He is aware of a previous history of hypertension but this was not previously treated.  He is on 240 mg of diltiazem and I increase his losartan to 50 mg daily today.  Will likely need further titration of losartan as an outpatient and potentially an additional agent.  6.  Obstructive sleep apnea: Previously diagnosed about 8 years ago.  Did not tolerate CPAP.  He will need repeat outpatient sleep eval.  Stressed importance of compliance with therapy.  7.  Morbid obesity: He has been on keto diet and has lost weight.  Discussed the importance of active lifestyle to maintain healthy weight.  8.  Hyperglycemia: A1c is 5.8.  9.  Aortic atherosclerosis: LDL of 112.  Risk factor/lifestyle modifications and consider statin therapy as an outpatient.  Signed, Nicolasa Ducking, NP  08/15/2020, 7:54  AM    For questions or updates, please contact   Please consult [email or web address] for contact info under Cardiology/STEMI.

## 2020-08-15 NOTE — Plan of Care (Signed)

## 2020-08-15 NOTE — Progress Notes (Signed)
Patient discharged per order, PIVs/tele removed from patient. Patient educated on DC instructions and is agreeable. Wheeled down to vehicle by volunteer.

## 2020-08-15 NOTE — Discharge Summary (Signed)
Physician Discharge Summary  Corrinne EagleBenjamin M Daniel ZOX:[id number] DOB: Jan 21, 1966 DOA: 08/13/2020  PCP: Pcp, No  Admit date: 08/13/2020 Discharge date: 08/15/2020  Discharge disposition: Home   Recommendations for Outpatient Follow-Up:   Follow-up with PCP in 7 to 10 days Follow-up with cardiologist as scheduled (office will call to schedule an appointment).   Discharge Diagnosis:   Active Problems:   Atrial fibrillation with RVR (HCC)   Primary hypertension   Alcohol-induced acute pancreatitis   Prediabetes    Discharge Condition: Stable.  Diet recommendation:  Diet Order            Diet Heart Room service appropriate? Yes; Fluid consistency: Thin  Diet effective now           Diet - low sodium heart healthy           Diet Carb Modified                   Code Status: Full Code     Hospital Course:   Mr. Herbert Daniel is a 55 year old man with medical history significant for tobacco use disorder, alcohol use disorder, prediabetes, who presented to the hospital because of abdominal pain for 1 week duration.  He stated his last alcoholic drink was about a week prior to admission.  In the ED, he was noted to be tachycardic with a heart rate in the 130s to 150s.    He was found to have atrial fibrillation with rapid ventricular response and acute pancreatitis.  Troponins were unremarkable.  He was treated with IV Cardizem infusion and IV heparin drip.  He was started on antihypertensives because of hypertensive urgency.  Cardiologist was consulted to assist with management. 2D echo showed EF estimated at 60 to 65%, mild LVH.  He was transitioned to Eliquis for stroke prophylaxis.  He complained of anxiety and insomnia and attributed late night drinking to inability to sleep and anxiety.  He requested treatment for anxiety and insomnia.  He was started on Celexa and trazodone and outpatient follow-up with PCP was recommended for ongoing treatment.   Risks and benefits  of all new medications, including long-term anticoagulation with Eliquis, were discussed and he verbalized understanding and agreed to proceed with treatment.  Was advised to make positive lifestyle changes including weight loss, regular exercise, healthy eating habits, avoiding use of alcohol and cigarette smoking.  He said he has had prediabetes for a while.  His condition has improved and he has been able to tolerate a regular diet without any symptoms. He feels better and he is deemed stable for discharge to home today.   Medical Consultants:    Cardiologist   Discharge Exam:    Vitals:   08/14/20 2100 08/15/20 0447 08/15/20 0729 08/15/20 1102  BP: (!) 166/85 (!) 148/78 (!) 163/86 (!) 158/88  Pulse: 76 63 67 74  Resp:  18 16 17   Temp:  97.8 F (36.6 C) 97.7 F (36.5 C) 97.6 F (36.4 C)  TempSrc:  Oral    SpO2:  92% 94% 94%  Weight:      Height:         GEN: NAD SKIN: No rash EYES: EOMI ENT: MMM CV: RRR PULM: CTA B ABD: soft, ND, NT, +BS CNS: AAO x 3, non focal EXT: No edema or tenderness   The results of significant diagnostics from this hospitalization (including imaging, microbiology, ancillary and laboratory) are listed below for reference.     Procedures and Diagnostic Studies:  No results found.   Labs:   Basic Metabolic Panel: Recent Labs  Lab 08/13/20 0837 08/14/20 0437  NA 134* 137  K 3.9 4.1  CL 97* 101  CO2 27 28  GLUCOSE 138* 109*  BUN 11 11  CREATININE 0.80 0.88  CALCIUM 9.6 8.8*   GFR Estimated Creatinine Clearance: 120.2 mL/min (by C-G formula based on SCr of 0.88 mg/dL). Liver Function Tests: Recent Labs  Lab 08/13/20 0837  AST 25  ALT 26  ALKPHOS 71  BILITOT 1.3*  PROT 8.2*  ALBUMIN 4.2   Recent Labs  Lab 08/13/20 0837  LIPASE 38   No results for input(s): AMMONIA in the last 168 hours. Coagulation profile Recent Labs  Lab 08/13/20 1717  INR 1.2    CBC: Recent Labs  Lab 08/13/20 0837 08/14/20 0437  08/15/20 0432  WBC 17.6* 13.2* 9.9  HGB 17.9* 14.9 14.2  HCT 51.2 43.7 42.4  MCV 89.4 92.8 92.0  PLT 261 191 201   Cardiac Enzymes: No results for input(s): CKTOTAL, CKMB, CKMBINDEX, TROPONINI in the last 168 hours. BNP: Invalid input(s): POCBNP CBG: No results for input(s): GLUCAP in the last 168 hours. D-Dimer No results for input(s): DDIMER in the last 72 hours. Hgb A1c Recent Labs    08/14/20 0437  HGBA1C 5.8*   Lipid Profile Recent Labs    08/14/20 2044  CHOL 191  HDL 36*  LDLCALC 112*  TRIG 213*  CHOLHDL 5.3   Thyroid function studies Recent Labs    08/13/20 0914  TSH 2.647   Anemia work up No results for input(s): VITAMINB12, FOLATE, FERRITIN, TIBC, IRON, RETICCTPCT in the last 72 hours. Microbiology Recent Results (from the past 240 hour(s))  Resp Panel by RT-PCR (Flu A&B, Covid) Nasopharyngeal Swab     Status: None   Collection Time: 08/13/20  9:14 AM   Specimen: Nasopharyngeal Swab; Nasopharyngeal(NP) swabs in vial transport medium  Result Value Ref Range Status   SARS Coronavirus 2 by RT PCR NEGATIVE NEGATIVE Final    Comment: (NOTE) SARS-CoV-2 target nucleic acids are NOT DETECTED.  The SARS-CoV-2 RNA is generally detectable in upper respiratory specimens during the acute phase of infection. The lowest concentration of SARS-CoV-2 viral copies this assay can detect is 138 copies/mL. A negative result does not preclude SARS-Cov-2 infection and should not be used as the sole basis for treatment or other patient management decisions. A negative result may occur with  improper specimen collection/handling, submission of specimen other than nasopharyngeal swab, presence of viral mutation(s) within the areas targeted by this assay, and inadequate number of viral copies(<138 copies/mL). A negative result must be combined with clinical observations, patient history, and epidemiological information. The expected result is Negative.  Fact Sheet for  Patients:  BloggerCourse.com  Fact Sheet for Healthcare Providers:  SeriousBroker.it  This test is no t yet approved or cleared by the [place] FDA and  has been authorized for detection and/or diagnosis of SARS-CoV-2 by FDA under an Emergency Use Authorization (EUA). This EUA will remain  in effect (meaning this test can be used) for the duration of the COVID-19 declaration under Section 564(b)(1) of the Act, 21 U.S.C.section 360bbb-3(b)(1), unless the authorization is terminated  or revoked sooner.       Influenza A by PCR NEGATIVE NEGATIVE Final   Influenza B by PCR NEGATIVE NEGATIVE Final    Comment: (NOTE) The Xpert Xpress SARS-CoV-2/FLU/RSV plus assay is intended as an aid in the diagnosis of influenza from Nasopharyngeal swab  specimens and should not be used as a sole basis for treatment. Nasal washings and aspirates are unacceptable for Xpert Xpress SARS-CoV-2/FLU/RSV testing.  Fact Sheet for Patients: BloggerCourse.com  Fact Sheet for Healthcare Providers: SeriousBroker.it  This test is not yet approved or cleared by the [place] FDA and has been authorized for detection and/or diagnosis of SARS-CoV-2 by FDA under an Emergency Use Authorization (EUA). This EUA will remain in effect (meaning this test can be used) for the duration of the COVID-19 declaration under Section 564(b)(1) of the Act, 21 U.S.C. section 360bbb-3(b)(1), unless the authorization is terminated or revoked.  Performed at [facility], [address] [place], [place] [address]   Blood culture (routine x 2)     Status: None (Preliminary result)   Collection Time: 08/13/20  9:14 AM   Specimen: BLOOD  Result Value Ref Range Status   Specimen Description BLOOD RIGHT ANTECUBITAL  Final   Special Requests   Final    BOTTLES DRAWN AEROBIC AND ANAEROBIC Blood Culture adequate volume    Culture   Final    NO GROWTH 2 DAYS Performed at [facility], [address] [place], [place] [address]    Report Status PENDING  Incomplete  Blood culture (routine x 2)     Status: None (Preliminary result)   Collection Time: 08/13/20  9:14 AM   Specimen: BLOOD  Result Value Ref Range Status   Specimen Description BLOOD BLOOD LEFT WRIST  Final   Special Requests   Final    BOTTLES DRAWN AEROBIC AND ANAEROBIC Blood Culture adequate volume   Culture   Final    NO GROWTH 2 DAYS Performed at [facility], [address] [place], [place] [address]    Report Status PENDING  Incomplete     Discharge Instructions:   Discharge Instructions    Diet - low sodium heart healthy   Complete by: As directed    Diet Carb Modified   Complete by: As directed    Discharge instructions   Complete by: As directed    Avoid alcohol and cigarette smoking   Increase activity slowly   Complete by: As directed      Allergies as of 08/15/2020   No Known Allergies     Medication List    TAKE these medications   acetaminophen 325 MG tablet Commonly known as: TYLENOL Take 650 mg by mouth every 6 (six) hours as needed.   apixaban 5 MG Tabs tablet Commonly known as: ELIQUIS Take 1 tablet (5 mg total) by mouth 2 (two) times daily.   citalopram 10 MG tablet Commonly known as: CELEXA Take 1 tablet (10 mg total) by mouth daily. Start taking on: Aug 16, 2020   diltiazem 240 MG 24 hr capsule Commonly known as: CARDIZEM CD Take 1 capsule (240 mg total) by mouth daily. Start taking on: Aug 16, 2020   losartan 50 MG tablet Commonly known as: COZAAR Take 1 tablet (50 mg total) by mouth daily. Start taking on: Aug 16, 2020   traZODone 50 MG tablet Commonly known as: DESYREL Take 0.5 tablets (25 mg total) by mouth at bedtime as needed for sleep.       Follow-up Information    Antonieta Iba, MD Follow up in 2 week(s).   Specialty: Cardiology Why: We will arrange  for follow-up and contact you. Contact information: [address] [place] [address] [phone]  Time coordinating discharge: 33 minutes  Signed:  Majid Mccravy  [facility] 08/15/2020, 2:49 PM   Pager on [email or web address]. If 7PM-7AM, please contact night-coverage at [email or web address]

## 2020-08-15 NOTE — Consult Note (Signed)
ANTICOAGULATION CONSULT NOTE   Pharmacy Consult for heparin infusion Indication: atrial fibrillation  No Known Allergies  Patient Measurements: Height: 5\' 10"  (177.8 cm) Weight: 117.2 kg (258 lb 6.4 oz) IBW/kg (Calculated) : 73 Heparin Dosing Weight: 99.2 kg   Vital Signs: Temp: 97.9 F (36.6 C) (04/30 1942) Temp Source: Oral (04/30 1942) BP: 148/78 (05/01 0447) Pulse Rate: 63 (05/01 0447)  Labs: Recent Labs    08/13/20 0837 08/13/20 0914 08/13/20 1159 08/13/20 1717 08/13/20 2306 08/14/20 0437 08/14/20 0657 08/14/20 1452 08/14/20 2044 08/15/20 0432  HGB 17.9*  --   --   --   --  14.9  --   --   --  14.2  HCT 51.2  --   --   --   --  43.7  --   --   --  42.4  PLT 261  --   --   --   --  191  --   --   --  201  APTT  --   --   --  34  --   --   --   --   --   --   LABPROT  --   --   --  14.7  --   --   --   --   --   --   INR  --   --   --  1.2  --   --   --   --   --   --   HEPARINUNFRC  --   --   --   --    < >  --  0.26* 0.35 0.33 0.41  CREATININE 0.80  --   --   --   --  0.88  --   --   --   --   TROPONINIHS  --    < > 19*  --   --  16 13  --   --   --    < > = values in this interval not displayed.    Estimated Creatinine Clearance: 120.2 mL/min (by C-G formula based on SCr of 0.88 mg/dL).   Medical History: Past Medical History:  Diagnosis Date  . ETOH abuse   . Tobacco abuse     Medications:  No prior AC noted  Assessment: 55 y.o. male with medical history significant for alcohol abuse presented to ED with new onset Afib w/ RVR. Pharmacy has been consulted for initiationan and management of heparin infusion for A fib.   Heparin Dosing Weight: 99.2 kg  Baseline CBC reviewed. PT/INR, aPTT ordered   4/29:  HL @ 2306 = 0.18     Heparin 3000 units IV X 1 bolus and increase drip rate to 1900 units/hr.  4/30:  HL @ 0657 = 0.26    1500 units IV bolus,increase to 2100 units/hr.  4/30:  HL @ 1452 = 0.36, therapeutic x 1 4/30:  HL @ 2044 = 0.33,  therapeutic x 2 5/01:  HL @ 0432 = 0.41, therapeutic X 3  Goal of Therapy:  Heparin level 0.3-0.7 units/ml Monitor platelets by anticoagulation protocol: Yes   Plan:  5/1: HL @ 0432 = 0.41 Will continue pt on current rate and recheck HL on 5/2 with AM labs.   Tierra Divelbiss D 08/15/2020 5:57 AM

## 2020-08-16 ENCOUNTER — Telehealth: Payer: Self-pay | Admitting: Cardiovascular Disease

## 2020-08-16 NOTE — Telephone Encounter (Signed)
-----   Message from Creig Hines, NP sent at 08/15/2020  1:44 PM EDT ----- Good morning,  Would you please arrange for follow-up for this patient in about 2 to 3 weeks with me or Gollan?  Other APP okay also.  Thanks,  Thayer Ohm

## 2020-08-16 NOTE — Telephone Encounter (Signed)
Attempted to schedule.  LMOV to call office.  ° °

## 2020-08-18 DIAGNOSIS — F17200 Nicotine dependence, unspecified, uncomplicated: Secondary | ICD-10-CM | POA: Insufficient documentation

## 2020-08-18 LAB — CULTURE, BLOOD (ROUTINE X 2)
Culture: NO GROWTH
Culture: NO GROWTH
Special Requests: ADEQUATE
Special Requests: ADEQUATE

## 2020-08-18 NOTE — Progress Notes (Signed)
Cardiology Office Note  Date:  08/20/2020   ID:  Herbert Daniel, DOB 07-21-1965, MRN [id number]  PCP:  Pcp, No   Chief Complaint  Patient presents with  . [facility] follow up     Patient c/o occasional rapid heart beats and is not sleeping during the night. Medications reviewed by the patient verbally.     HPI:  Mr. Herbert Daniel is a 55 year old gentleman with history of  smoking,  alcohol abuse,  Presenting to the hospital August 13, 2020 with  abdominal pain, tachycardia Noted to be in atrial fibrillation with RVR Pancreatitis diagnosis Who presents to establish care in the office for his atrial fibrillation, follow-up after discharge from the hospital  Presenting to the hospital shortness of breath, palpitations after he stopped drinking alcohol though continued to have insomnia, abdominal pain EKG did confirm atrial fibrillation with RVR rate 180 bpm  While in the hospital as detailed above he converted to normal sinus rhythm Heparin was held, changed to Eliquis Pancreatitis managed by his medicine team He was changed to diltiazem extended release to 40 daily with losartan 50 for blood pressure  Lab work reviewed A1c 5.8  In follow-up today denies any tachypalpitations concerning for atrial fibrillation Busy job, works in Holiday representative, reports able to climb stairs, works in the heart, just changed out air [employer] on a Restaurant manager, fast food to [employer]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  PSH:   History reviewed. No pertinent surgical history.  Current Outpatient Medications  Medication Sig  Dispense Refill  . metoprolol tartrate (LOPRESSOR) 50 MG tablet Take 1 tablet (50 mg total) by mouth 2 (two) times daily as needed. 60 tablet 2  . acetaminophen (TYLENOL) 325 MG tablet Take 650 mg by mouth every 6 (six) hours as needed.    Marland Kitchen apixaban (ELIQUIS) 5 MG TABS tablet Take 1 tablet (5 mg total) by mouth 2 (two) times daily. 60 tablet 11  . citalopram (CELEXA) 10 MG tablet Take 1 tablet (10 mg total) by mouth daily. 30 tablet 0  . diltiazem (CARDIZEM CD) 240 MG 24 hr capsule Take 1 capsule (240 mg total) by mouth daily. 30 capsule 11  . losartan (COZAAR) 50 MG tablet Take 1 tablet (50 mg total) by mouth daily. 30 tablet 11  . traZODone (DESYREL) 50 MG tablet Take 1 tablet (50 mg total) by mouth at bedtime as needed for sleep. Future refills will need to be by PCP once establish.  One time fill by Dr. Mariah Milling 30 tablet 0   No current facility-administered medications for this visit.     Allergies:   Patient has no known allergies.   Social History:  The patient  reports that he has been smoking. He has been smoking about 0.25 packs per day. He has never used smokeless tobacco. He reports current alcohol use. He reports that he does not use drugs.   Family History:   family history is not on file.    Review of Systems: Review of Systems  Constitutional: Negative.   HENT: Negative.   Respiratory: Negative.   Cardiovascular: Negative.  Gastrointestinal: Negative.   Musculoskeletal: Negative.   Neurological: Negative.   Psychiatric/Behavioral: Negative.   All other systems reviewed and are negative.    PHYSICAL EXAM: VS:  BP 140/90 (BP Location: Left Arm, Patient Position: Sitting, Cuff Size: Large)   Pulse 78   Ht 5\' 11"  (1.803 m)   Wt 260 lb (117.9 kg)   SpO2 98%   BMI 36.26 kg/m  , BMI Body mass index is 36.26 kg/m. GEN: Well nourished, well developed, in no acute distress HEENT: normal Neck: no JVD, carotid bruits, or masses Cardiac: RRR; no murmurs, rubs, or  gallops,no edema  Respiratory:  clear to auscultation bilaterally, normal work of breathing GI: soft, nontender, nondistended, + BS MS: no deformity or atrophy Skin: warm and dry, no rash Neuro:  Strength and sensation are intact Psych: euthymic mood, full affect  Recent Labs: 08/13/2020: ALT 26; B Natriuretic Peptide 122.4; TSH 2.647 08/14/2020: BUN 11; Creatinine, Ser 0.88; Potassium 4.1; Sodium 137 08/15/2020: Hemoglobin 14.2; Platelets 201    Lipid Panel Lab Results  Component Value Date   CHOL 191 08/14/2020   HDL 36 (L) 08/14/2020   LDLCALC 112 (H) 08/14/2020   TRIG 213 (H) 08/14/2020      Wt Readings from Last 3 Encounters:  08/20/20 260 lb (117.9 kg)  08/13/20 258 lb 6.4 oz (117.2 kg)      ASSESSMENT AND PLAN:  Problem List Items Addressed This Visit      Cardiology Problems   Primary hypertension   Relevant Medications   metoprolol tartrate (LOPRESSOR) 50 MG tablet   apixaban (ELIQUIS) 5 MG TABS tablet   diltiazem (CARDIZEM CD) 240 MG 24 hr capsule   losartan (COZAAR) 50 MG tablet   Other Relevant Orders   EKG 12-Lead   Atrial fibrillation with RVR (HCC) - Primary   Relevant Medications   metoprolol tartrate (LOPRESSOR) 50 MG tablet   apixaban (ELIQUIS) 5 MG TABS tablet   diltiazem (CARDIZEM CD) 240 MG 24 hr capsule   losartan (COZAAR) 50 MG tablet   Other Relevant Orders   EKG 12-Lead     Other   Smoker   Alcohol-induced acute pancreatitis      Atrial fibrillation with RVR Discussed recent hospitalization, records requested and reviewed Normal sinus rhythm holding on diltiazem extended release 240 daily At risk of recurrent arrhythmia, we have provided metoprolol tartrate 50 mg to take as needed for any breakthrough tachypalpitations concerning for atrial fibrillation  Smoker Smoking cessation recommended  Alcohol Cessation suggested Trigger for atrial fibrillation previously discussed  Essential hypertension Minimally elevated today, no  changes, recommend we continue to monitor blood pressure for now with further medication adjustment if needed If additional medication needed could increase the Cardizem to 300 or add dose of metoprolol succinate daily    Total encounter time more than 35 minutes  Greater than 50% was spent in counseling and coordination of care with the patient    Signed, 08/15/20, M.D., Ph.D. [facility] [place], [place]

## 2020-08-20 ENCOUNTER — Other Ambulatory Visit: Payer: Self-pay

## 2020-08-20 ENCOUNTER — Encounter: Payer: Self-pay | Admitting: Cardiovascular Disease

## 2020-08-20 ENCOUNTER — Ambulatory Visit (INDEPENDENT_AMBULATORY_CARE_PROVIDER_SITE_OTHER): Payer: BC Managed Care – PPO | Admitting: Cardiovascular Disease

## 2020-08-20 VITALS — BP 140/90 | HR 78 | Ht 71.0 in | Wt 260.0 lb

## 2020-08-20 DIAGNOSIS — I10 Essential (primary) hypertension: Secondary | ICD-10-CM | POA: Diagnosis not present

## 2020-08-20 DIAGNOSIS — I4891 Unspecified atrial fibrillation: Secondary | ICD-10-CM

## 2020-08-20 DIAGNOSIS — K852 Alcohol induced acute pancreatitis without necrosis or infection: Secondary | ICD-10-CM

## 2020-08-20 DIAGNOSIS — F17200 Nicotine dependence, unspecified, uncomplicated: Secondary | ICD-10-CM | POA: Diagnosis not present

## 2020-08-20 MED ORDER — LOSARTAN POTASSIUM 50 MG PO TABS: 50.0000 mg | ORAL_TABLET | Freq: Every day | ORAL | 11 refills | Status: AC

## 2020-08-20 MED ORDER — TRAZODONE HCL 50 MG PO TABS: 50.0000 mg | ORAL_TABLET | Freq: Every evening | ORAL | 0 refills | Status: DC | PRN

## 2020-08-20 MED ORDER — TRAZODONE HCL 50 MG PO TABS: 50.0000 mg | ORAL_TABLET | Freq: Every evening | ORAL | 0 refills | Status: AC | PRN

## 2020-08-20 MED ORDER — APIXABAN 5 MG PO TABS: 5.0000 mg | ORAL_TABLET | Freq: Two times a day (BID) | ORAL | 11 refills | Status: AC

## 2020-08-20 MED ORDER — METOPROLOL TARTRATE 50 MG PO TABS: 50.0000 mg | ORAL_TABLET | Freq: Two times a day (BID) | ORAL | 2 refills | Status: AC | PRN

## 2020-08-20 MED ORDER — DILTIAZEM HCL ER COATED BEADS 240 MG PO CP24: 240.0000 mg | ORAL_CAPSULE | Freq: Every day | ORAL | 11 refills | Status: AC

## 2020-08-20 NOTE — Patient Instructions (Addendum)
Try to call around to different offices and ask if they are accepting new patients. Try calling: [facility] [phone] [employer] at [place] [phone] [employer] [facility] [employer] [phone] [facility] [phone] [facility] Sherrie Sport [phone] [facility] Dan Humphreys [phone]   Medication Instructions:  Please take metoprolol tartrate 50 mg as needed for atrial fibrillation  If you need a refill on your cardiac medications before your next appointment, please call your pharmacy.    Lab work: No new labs needed  Testing/Procedures: No new testing needed  Follow-Up:  . You will need a follow up appointment in 6 months  . Providers on your designated Care Team:   . Nicolasa Ducking, NP . Eula Listen, PA-C . Marisue Ivan, PA-C  COVID-19 Vaccine Information can be found at: [email or web address] For questions related to vaccine distribution or appointments, please email [email or web address] or call [phone].

## 2020-09-17 ENCOUNTER — Other Ambulatory Visit: Payer: Self-pay | Admitting: Cardiovascular Disease

## 2020-09-17 NOTE — Telephone Encounter (Signed)
Medication sleep aid, not authorized to refill. Please advise if provider would like to refill and send it to the pharmacy on file.

## 2020-09-29 ENCOUNTER — Other Ambulatory Visit: Payer: Self-pay

## 2020-09-29 ENCOUNTER — Emergency Department: Payer: BC Managed Care – PPO

## 2020-09-29 ENCOUNTER — Encounter: Payer: Self-pay | Admitting: *Deleted

## 2020-09-29 ENCOUNTER — Emergency Department
Admission: EM | Admit: 2020-09-29 | Discharge: 2020-09-29 | Disposition: A | Discharge status: Home / Self Care | Payer: BC Managed Care – PPO | Attending: Emergency Medicine | Admitting: Emergency Medicine

## 2020-09-29 DIAGNOSIS — M5442 Lumbago with sciatica, left side: Secondary | ICD-10-CM | POA: Insufficient documentation

## 2020-09-29 DIAGNOSIS — Z79899 Other long term (current) drug therapy: Secondary | ICD-10-CM | POA: Insufficient documentation

## 2020-09-29 DIAGNOSIS — X58XXXA Exposure to other specified factors, initial encounter: Secondary | ICD-10-CM | POA: Insufficient documentation

## 2020-09-29 DIAGNOSIS — F17210 Nicotine dependence, cigarettes, uncomplicated: Secondary | ICD-10-CM | POA: Diagnosis not present

## 2020-09-29 DIAGNOSIS — Y9353 Activity, golf: Secondary | ICD-10-CM | POA: Insufficient documentation

## 2020-09-29 DIAGNOSIS — I10 Essential (primary) hypertension: Secondary | ICD-10-CM | POA: Diagnosis not present

## 2020-09-29 DIAGNOSIS — Z7901 Long term (current) use of anticoagulants: Secondary | ICD-10-CM | POA: Diagnosis not present

## 2020-09-29 DIAGNOSIS — S3992XA Unspecified injury of lower back, initial encounter: Secondary | ICD-10-CM | POA: Diagnosis not present

## 2020-09-29 DIAGNOSIS — M545 Low back pain, unspecified: Secondary | ICD-10-CM | POA: Diagnosis not present

## 2020-09-29 DIAGNOSIS — S39012A Strain of muscle, fascia and tendon of lower back, initial encounter: Secondary | ICD-10-CM | POA: Insufficient documentation

## 2020-09-29 DIAGNOSIS — M5432 Sciatica, left side: Secondary | ICD-10-CM

## 2020-09-29 LAB — URINALYSIS, COMPLETE (UACMP) WITH MICROSCOPIC
Bacteria, UA: NONE SEEN
Bilirubin Urine: NEGATIVE
Glucose, UA: NEGATIVE mg/dL
Ketones, ur: NEGATIVE mg/dL
Leukocytes,Ua: NEGATIVE
Nitrite: NEGATIVE
Protein, ur: NEGATIVE mg/dL
Specific Gravity, Urine: 1.021 (ref 1.005–1.030)
Squamous Epithelial / HPF: NONE SEEN (ref 0–5)
pH: 5 (ref 5.0–8.0)

## 2020-09-29 MED ORDER — ORPHENADRINE CITRATE 30 MG/ML IJ SOLN
60.0000 mg | Freq: Two times a day (BID) | INTRAMUSCULAR | Status: DC
  Administered 2020-09-29: 60 mg via INTRAMUSCULAR
  Filled 2020-09-29: qty 2

## 2020-09-29 MED ORDER — METHYLPREDNISOLONE 4 MG PO TBPK: ORAL_TABLET | ORAL | 0 refills | Status: AC

## 2020-09-29 MED ORDER — METHYLPREDNISOLONE SODIUM SUCC 125 MG IJ SOLR
125.0000 mg | Freq: Once | INTRAMUSCULAR | Status: AC
  Administered 2020-09-29: 125 mg via INTRAMUSCULAR
  Filled 2020-09-29: qty 2

## 2020-09-29 MED ORDER — ORPHENADRINE CITRATE ER 100 MG PO TB12: 100.0000 mg | ORAL_TABLET | Freq: Two times a day (BID) | ORAL | 0 refills | Status: DC

## 2020-09-29 MED ORDER — HYDROMORPHONE HCL 1 MG/ML IJ SOLN
1.0000 mg | Freq: Once | INTRAMUSCULAR | Status: AC
  Administered 2020-09-29: 1 mg via INTRAMUSCULAR
  Filled 2020-09-29: qty 1

## 2020-09-29 MED ORDER — OXYCODONE-ACETAMINOPHEN 7.5-325 MG PO TABS: 1.0000 | ORAL_TABLET | Freq: Four times a day (QID) | ORAL | 0 refills | Status: DC | PRN

## 2020-09-29 NOTE — Discharge Instructions (Signed)
No acute findings on x-ray of the lumbar spine.  Read and follow discharge care instruction take medication as directed.  Be advised combination of muscle relaxers and pain medication may cause drowsiness.  Do not operate vehicles or machinery while taking these medicines.

## 2020-09-29 NOTE — ED Provider Notes (Signed)
[facility] Emergency Department Provider Note   ____________________________________________   Event Date/Time   First MD Initiated Contact with Patient 09/29/20 [phone]     (approximate)  I have reviewed the triage vital signs and the nursing notes.   HISTORY  Chief Complaint Back Pain    HPI Herbert Daniel is a 55 y.o. male patient complain low back pain with referred radicular component to the left lower extremity.  Patient denies bladder or bowel dysfunction.  Onset of complaint was 3 days ago status post playing golf.  Patient did pain progressively worse but increased after prolonged driving from [place] to  Home location.  Patient rates the pain as a 10/10.  Patient described pain as "achy and spasmatic.  No palliative measure for complaint.  When questioned about about elevated blood pressure stated he does have hypertension has not taken his medicine for the day.  Stated he spent the night in the emergency room.      Past Medical History:  Diagnosis Date   ETOH abuse    Tobacco abuse     Patient Active Problem List   Diagnosis Date Noted   Smoker 08/18/2020   Prediabetes 08/15/2020   Primary hypertension    Alcohol-induced acute pancreatitis    Atrial fibrillation with RVR (HCC) 08/13/2020    No past surgical history on file.  Prior to Admission medications   Medication Sig Start Date End Date Taking? Authorizing Provider  methylPREDNISolone (MEDROL DOSEPAK) 4 MG TBPK tablet Take Tapered dose as directed 09/29/20  Yes Joni Reining, PA-C  orphenadrine (NORFLEX) 100 MG tablet Take 1 tablet (100 mg total) by mouth 2 (two) times daily. 09/29/20  Yes Joni Reining, PA-C  oxyCODONE-acetaminophen (PERCOCET) 7.5-325 MG tablet Take 1 tablet by mouth every 6 (six) hours as needed for severe pain. 09/29/20  Yes Joni Reining, PA-C  acetaminophen (TYLENOL) 325 MG tablet Take 650 mg by mouth every 6 (six) hours as needed.    [provider]  apixaban (ELIQUIS) 5 MG TABS tablet Take 1 tablet (5 mg total) by mouth 2 (two) times daily. 08/20/20   Antonieta Iba, MD  citalopram (CELEXA) 10 MG tablet Take 1 tablet (10 mg total) by mouth daily. 08/16/20 09/15/20  Lurene Shadow, MD  diltiazem (CARDIZEM CD) 240 MG 24 hr capsule Take 1 capsule (240 mg total) by mouth daily. 08/20/20 09/19/20  Antonieta Iba, MD  losartan (COZAAR) 50 MG tablet Take 1 tablet (50 mg total) by mouth daily. 08/20/20 09/19/20  Antonieta Iba, MD  metoprolol tartrate (LOPRESSOR) 50 MG tablet Take 1 tablet (50 mg total) by mouth 2 (two) times daily as needed. 08/20/20 11/18/20  Antonieta Iba, MD  traZODone (DESYREL) 50 MG tablet Take 1 tablet (50 mg total) by mouth at bedtime as needed for sleep. Future refills will need to be by PCP once establish.  One time fill by Dr. Mariah Milling 08/20/20   Antonieta Iba, MD    Allergies Patient has no known allergies.  No family history on file.  Social History Social History   Tobacco Use   Smoking status: Every Day    Packs/day: 0.25    Pack years: 0.00    Types: Cigarettes   Smokeless tobacco: Never  Vaping Use   Vaping Use: Never used  Substance Use Topics   Alcohol use: Yes    Comment: pt states that he just stopped drinking, pt drink 3 beers per night  Drug use: Never    Review of Systems Constitutional: No fever/chills Eyes: No visual changes. ENT: No sore throat. Cardiovascular: Denies chest pain. Respiratory: Denies shortness of breath. Gastrointestinal: No abdominal pain.  No nausea, no vomiting.  No diarrhea.  No constipation. Genitourinary: Negative for dysuria. Musculoskeletal: Positive for back pain. Skin: Negative for rash. Neurological: Negative for headaches, focal weakness or numbness. Endocrine: Hypertension ____________________________________________   PHYSICAL EXAM:  VITAL SIGNS: ED Triage Vitals  Enc Vitals Group     BP 09/29/20 0202 (!) 181/95     Pulse Rate  09/29/20 0202 77     Resp 09/29/20 0202 18     Temp 09/29/20 0202 97.9 F (36.6 C)     Temp Source 09/29/20 0202 Oral     SpO2 09/29/20 0202 99 %     Weight 09/29/20 0201 250 lb (113.4 kg)     Height 09/29/20 0201 5\' 10"  (1.778 m)     Head Circumference --      Peak Flow --      Pain Score 09/29/20 0215 10     Pain Loc --      Pain Edu? --      Excl. in GC? --     Constitutional: Alert and oriented.  Moderate distress.   Eyes: Conjunctivae are normal. PERRL. EOMI. Head: Atraumatic. Nose: No congestion/rhinnorhea. Mouth/Throat: Mucous membranes are moist.  Oropharynx non-erythematous. Neck: No stridor.  No cervical spine tenderness to palpation. Hematological/Lymphatic/Immunilogical: No cervical lymphadenopathy. Cardiovascular: Normal rate, regular rhythm. Grossly normal heart sounds.  Good peripheral circulation.  Elevated blood pressure. Respiratory: Normal respiratory effort.  No retractions. Lungs CTAB. Gastrointestinal: Soft and nontender. No distention. No abdominal bruits. No CVA tenderness. Genitourinary: Deferred Musculoskeletal: No lower extremity tenderness nor edema.  No joint effusions. Neurologic:  Normal speech and language. No gross focal neurologic deficits are appreciated. No gait instability. Skin:  Skin is warm, dry and intact. No rash noted. Psychiatric: Mood and affect are normal. Speech and behavior are normal.  ____________________________________________   LABS (all labs ordered are listed, but only abnormal results are displayed)  Labs Reviewed  URINALYSIS, COMPLETE (UACMP) WITH MICROSCOPIC - Abnormal; Notable for the following components:      Result Value   Color, Urine YELLOW (*)    APPearance HAZY (*)    Hgb urine dipstick SMALL (*)    All other components within normal limits   ____________________________________________  EKG   ____________________________________________  RADIOLOGY I, 10/01/20, personally viewed and evaluated  these images (plain radiographs) as part of my medical decision making, as well as reviewing the written report by the radiologist.  ED MD interpretation: No acute findings on x-ray of the lumbar spine.  Official radiology report(s): DG Lumbar Spine Complete  Result Date: 09/29/2020 CLINICAL DATA:  Low back pain radiating into the left leg for several days, initial encounter EXAM: LUMBAR SPINE - COMPLETE 4+ VIEW COMPARISON:  None. FINDINGS: Five lumbar type vertebral bodies are well visualized. L5 is partially sacralized on the left. No pars defects are seen. No anterolisthesis is noted. No soft tissue abnormality is seen. Osteophytic changes are noted most prominent on the right at T12-L1. IMPRESSION: Mild degenerative change without acute abnormality. Electronically Signed   By: 10/01/2020 M.D.   On: 09/29/2020 09:54    ____________________________________________   PROCEDURES  Procedure(s) performed (including Critical Care):  Procedures   ____________________________________________   INITIAL IMPRESSION / ASSESSMENT AND PLAN / ED COURSE  As part of my  medical decision making, I reviewed the following data within the electronic MEDICAL RECORD NUMBER         Patient presents  acute low back pain radicular component to the left lower extremity.  No bladder or bowel dysfunction.  No signs symptoms of cauda equina.  Discussed x-ray findings with patient.  Patient complaint physical exam consistent with lumbar strain with radicular component to the left lower extremity.  Patient given discharge care instructions and advised [facility].      ____________________________________________   FINAL CLINICAL IMPRESSION(S) / ED DIAGNOSES  Final diagnoses:  Strain of lumbar region, initial encounter  Sciatica of left side     ED Discharge Orders          Ordered    oxyCODONE-acetaminophen (PERCOCET) 7.5-325 MG tablet  Every 6 hours PRN        09/29/20  1003    orphenadrine (NORFLEX) 100 MG tablet  2 times daily        09/29/20 1003    methylPREDNISolone (MEDROL DOSEPAK) 4 MG TBPK tablet        09/29/20 1003             Note:  This document was prepared using Dragon voice recognition software and may include unintentional dictation errors.    Joni Reining, PA-C 09/29/20 1008    Sharman Cheek, MD 09/30/20 2325

## 2020-09-29 NOTE — ED Triage Notes (Signed)
Pt brought in via ems from home with lower back pain.  Pt states pain radiates into left leg.  Taking tylenol without relief.  Pt alert.  Denies urinary sx.

## 2020-09-29 NOTE — ED Notes (Signed)
See triage note  Presents with lower back pain which started this weekend  States pain is now moving into anterior left leg  Denies any injury  States he played golf this weekend and also drove back from [place]

## 2020-09-29 NOTE — ED Notes (Signed)
States pain has eased off   Is able to move slightly

## 2020-10-04 ENCOUNTER — Emergency Department
Admission: EM | Admit: 2020-10-04 | Discharge: 2020-10-04 | Disposition: A | Discharge status: Home / Self Care | Payer: BC Managed Care – PPO | Attending: Emergency Medicine | Admitting: Emergency Medicine

## 2020-10-04 ENCOUNTER — Other Ambulatory Visit: Payer: Self-pay

## 2020-10-04 DIAGNOSIS — F17210 Nicotine dependence, cigarettes, uncomplicated: Secondary | ICD-10-CM | POA: Diagnosis not present

## 2020-10-04 DIAGNOSIS — Z8679 Personal history of other diseases of the circulatory system: Secondary | ICD-10-CM | POA: Diagnosis not present

## 2020-10-04 DIAGNOSIS — E860 Dehydration: Secondary | ICD-10-CM | POA: Diagnosis not present

## 2020-10-04 DIAGNOSIS — Z7901 Long term (current) use of anticoagulants: Secondary | ICD-10-CM | POA: Diagnosis not present

## 2020-10-04 DIAGNOSIS — I10 Essential (primary) hypertension: Secondary | ICD-10-CM | POA: Diagnosis not present

## 2020-10-04 DIAGNOSIS — R52 Pain, unspecified: Secondary | ICD-10-CM | POA: Diagnosis not present

## 2020-10-04 DIAGNOSIS — Z79899 Other long term (current) drug therapy: Secondary | ICD-10-CM | POA: Diagnosis not present

## 2020-10-04 DIAGNOSIS — M5442 Lumbago with sciatica, left side: Secondary | ICD-10-CM | POA: Diagnosis not present

## 2020-10-04 DIAGNOSIS — M5432 Sciatica, left side: Secondary | ICD-10-CM

## 2020-10-04 DIAGNOSIS — M549 Dorsalgia, unspecified: Secondary | ICD-10-CM | POA: Diagnosis not present

## 2020-10-04 DIAGNOSIS — R0902 Hypoxemia: Secondary | ICD-10-CM | POA: Diagnosis not present

## 2020-10-04 DIAGNOSIS — M545 Low back pain, unspecified: Secondary | ICD-10-CM | POA: Diagnosis not present

## 2020-10-04 MED ORDER — TIZANIDINE HCL 4 MG PO TABS: 4.0000 mg | ORAL_TABLET | Freq: Three times a day (TID) | ORAL | 0 refills | Status: AC

## 2020-10-04 MED ORDER — LOSARTAN POTASSIUM 50 MG PO TABS
50.0000 mg | ORAL_TABLET | Freq: Once | ORAL | Status: AC
  Administered 2020-10-04: 50 mg via ORAL
  Filled 2020-10-04: qty 1

## 2020-10-04 MED ORDER — TIZANIDINE HCL 4 MG PO TABS
8.0000 mg | ORAL_TABLET | Freq: Once | ORAL | Status: AC
  Administered 2020-10-04: 8 mg via ORAL
  Filled 2020-10-04: qty 2

## 2020-10-04 MED ORDER — OXYCODONE HCL 5 MG PO TABS
10.0000 mg | ORAL_TABLET | Freq: Once | ORAL | Status: AC
  Administered 2020-10-04: 10 mg via ORAL
  Filled 2020-10-04: qty 2

## 2020-10-04 MED ORDER — METHOCARBAMOL 1000 MG/10ML IJ SOLN
1000.0000 mg | Freq: Once | INTRAMUSCULAR | Status: DC
  Filled 2020-10-04: qty 10

## 2020-10-04 MED ORDER — OXYCODONE-ACETAMINOPHEN 7.5-325 MG PO TABS: 1.0000 | ORAL_TABLET | Freq: Four times a day (QID) | ORAL | 0 refills | Status: AC | PRN

## 2020-10-04 MED ORDER — LIDOCAINE 5 % EX PTCH
1.0000 | MEDICATED_PATCH | CUTANEOUS | Status: DC
  Administered 2020-10-04: 1 via TRANSDERMAL
  Filled 2020-10-04: qty 1

## 2020-10-04 MED ORDER — METOPROLOL TARTRATE 50 MG PO TABS
50.0000 mg | ORAL_TABLET | Freq: Once | ORAL | Status: AC
  Administered 2020-10-04: 50 mg via ORAL
  Filled 2020-10-04: qty 1

## 2020-10-04 MED ORDER — DEXAMETHASONE SODIUM PHOSPHATE 10 MG/ML IJ SOLN
10.0000 mg | Freq: Once | INTRAMUSCULAR | Status: AC
  Administered 2020-10-04: 10 mg via INTRAVENOUS
  Filled 2020-10-04: qty 1

## 2020-10-04 NOTE — ED Provider Notes (Signed)
[facility] Emergency Department Provider Note ____________________________________________  Time seen: Approximately 12:26 PM  I have reviewed the triage vital signs and the nursing notes.  HISTORY  Chief Complaint Back Pain   HPI Herbert Daniel is a 55 y.o. male who presents to the emergency department for treatment and evaluation of back pain.  Patient states that he was recently treated for the same and had been improving until this morning.  He states that he bent over to pull up his pants and had a sudden burning, shooting pain from his back to left knee.  He had completed all the medications prescribed at his last visit.  He was able to get back onto his bed and called 911 for transportation to the emergency department.  IV fluids and fentanyl given in route.  Patient states that the fentanyl did not provide any relief whatsoever.  Past Medical History:  Diagnosis Date   ETOH abuse    Tobacco abuse     Patient Active Problem List   Diagnosis Date Noted   Smoker 08/18/2020   Prediabetes 08/15/2020   Primary hypertension    Alcohol-induced acute pancreatitis    Atrial fibrillation with RVR (HCC) 08/13/2020    No past surgical history on file.  Prior to Admission medications   Medication Sig Start Date End Date Taking? Authorizing Provider  tiZANidine (ZANAFLEX) 4 MG tablet Take 1 tablet (4 mg total) by mouth 3 (three) times daily. 10/04/20  Yes Yazlin Ekblad B, FNP  apixaban (ELIQUIS) 5 MG TABS tablet Take 1 tablet (5 mg total) by mouth 2 (two) times daily. 08/20/20   Antonieta Iba, MD  citalopram (CELEXA) 10 MG tablet Take 1 tablet (10 mg total) by mouth daily. 08/16/20 09/15/20  Lurene Shadow, MD  diltiazem (CARDIZEM CD) 240 MG 24 hr capsule Take 1 capsule (240 mg total) by mouth daily. 08/20/20 09/19/20  Antonieta Iba, MD  losartan (COZAAR) 50 MG tablet Take 1 tablet (50 mg total) by mouth daily. 08/20/20 09/19/20  Antonieta Iba, MD   methylPREDNISolone (MEDROL DOSEPAK) 4 MG TBPK tablet Take Tapered dose as directed 09/29/20   Joni Reining, PA-C  metoprolol tartrate (LOPRESSOR) 50 MG tablet Take 1 tablet (50 mg total) by mouth 2 (two) times daily as needed. 08/20/20 11/18/20  Antonieta Iba, MD  oxyCODONE-acetaminophen (PERCOCET) 7.5-325 MG tablet Take 1 tablet by mouth every 6 (six) hours as needed for severe pain. 10/04/20   Elkin Belfield, Rulon Eisenmenger B, FNP  traZODone (DESYREL) 50 MG tablet Take 1 tablet (50 mg total) by mouth at bedtime as needed for sleep. Future refills will need to be by PCP once establish.  One time fill by Dr. Mariah Milling 08/20/20   Antonieta Iba, MD    Allergies Patient has no known allergies.  No family history on file.  Social History Social History   Tobacco Use   Smoking status: Every Day    Packs/day: 0.25    Pack years: 0.00    Types: Cigarettes   Smokeless tobacco: Never  Vaping Use   Vaping Use: Never used  Substance Use Topics   Alcohol use: Yes    Comment: pt states that he just stopped drinking, pt drink 3 beers per night   Drug use: Never    Review of Systems Constitutional: Well appearing. Respiratory: Negative for dyspnea. Cardiovascular: Negative for change in skin temperature or color. Musculoskeletal:   Negative for chronic steroid use   Negative for trauma in the  presence of osteoporosis  Negative for age over 50 and trauma.  Negative for constitutional symptoms, or history of cancer   Negative for pain worse at night. Skin: Negative for rash, lesion, or wound.  Genitourinary: Negative for urinary retention. Rectal: Negative for fecal incontinence or new onset constipation/bowel habit changes. Hematological/Immunilogical: Negative for immunosuppression, IV drug use, or fever Neurological: Positive for burning, tingling, numb, electric, radiating pain in the left lower extremity.                        Negative for saddle anesthesia.                        Negative for  focal neurologic deficit, progressive or disabling symptoms             Negative for saddle anesthesia. ____________________________________________   PHYSICAL EXAM:  VITAL SIGNS: ED Triage Vitals  Enc Vitals Group     BP 10/04/20 1000 (!) 168/150     Pulse --      Resp 10/04/20 0959 (!) 24     Temp 10/04/20 0959 97.8 F (36.6 C)     Temp Source 10/04/20 0959 Oral     SpO2 10/04/20 0959 97 %     Weight 10/04/20 1000 240 lb (108.9 kg)     Height 10/04/20 1000 5\' 10"  (1.778 m)     Head Circumference --      Peak Flow --      Pain Score 10/04/20 1000 10     Pain Loc --      Pain Edu? --      Excl. in GC? --     Constitutional: Alert and oriented. Well appearing and in no acute distress. Eyes: Conjunctivae are clear without discharge or drainage.  Head: Atraumatic. Neck: Full, active range of motion. Respiratory: Respirations even and unlabored. Musculoskeletal: Decreased ROM of the back and left extremity, Strength 5/5 of the lower extremities as tested. Neurologic: Reflexes of the lower extremities are 2+.  Positive straight leg raise on the left side. Skin: Atraumatic.  Psychiatric: Behavior and affect are normal.  ____________________________________________   LABS (all labs ordered are listed, but only abnormal results are displayed)  Labs Reviewed - No data to display ____________________________________________  RADIOLOGY  Not indicated ____________________________________________   PROCEDURES  Procedure(s) performed:  Procedures ____________________________________________   INITIAL IMPRESSION / ASSESSMENT AND PLAN / ED COURSE  Herbert Daniel is a 55 y.o. male presents to the emergency department for treatment and evaluation of left-sided back pain.  See HPI for further details.  Patient is overall well-appearing.  Will medicate with Decadron, oxycodone, and Robaxin.   ----------------------------------------- 2:11 PM on  10/04/2020 ----------------------------------------- Some improvement after medications. Continues to have burning, deep buttock pain that radiates into the leg toward knee.  Medications  dexamethasone (DECADRON) injection 10 mg (10 mg Intravenous Given 10/04/20 1248)  oxyCODONE (Oxy IR/ROXICODONE) immediate release tablet 10 mg (10 mg Oral Given 10/04/20 1244)  tiZANidine (ZANAFLEX) tablet 8 mg (8 mg Oral Given 10/04/20 1342)  metoprolol tartrate (LOPRESSOR) tablet 50 mg (50 mg Oral Given 10/04/20 1440)  losartan (COZAAR) tablet 50 mg (50 mg Oral Given 10/04/20 1441)    ED Discharge Orders          Ordered    oxyCODONE-acetaminophen (PERCOCET) 7.5-325 MG tablet  Every 6 hours PRN        10/04/20 1459    tiZANidine (ZANAFLEX) 4  MG tablet  3 times daily        10/04/20 1459             Pertinent labs & imaging results that were available during my care of the patient were reviewed by me and considered in my medical decision making (see chart for details).   _________________________________________   FINAL CLINICAL IMPRESSION(S) / ED DIAGNOSES  Final diagnoses:  Sciatica of left side     If controlled substance prescribed during this visit, 12 month history viewed on the [facility] prior to issuing an initial prescription for Schedule II or III opiod.    Chinita Pester, FNP 10/07/20 1121    Merwyn Katos, MD 10/07/20 [phone]

## 2020-10-04 NOTE — ED Triage Notes (Signed)
Seen here Wednesday for sciatic nerve pain, had improved.  Bent over this am and the pain returned.  Reports pain today is worse than Wednesday.  EMS with IV, fluids and fentanyl, reports fentanyl did not help pain.

## 2020-10-04 NOTE — ED Triage Notes (Signed)
Pt in via [facility] from home with c/o sciatic nerve pain. Pt reports pain is 10/10. Pt also has yellow to orange urine. Pt with #20 to left AC. EMS administered 500 mls of fluid and 100 mcg of fentanyl

## 2021-10-01 IMAGING — CT CT ABD-PELV W/ CM
2 of 5 series · 15 of 46 positions shown, 17 images · IV contrast (APPLIED)
Comparison: None.

CLINICAL DATA: Abdominal and back pain for the past week.

EXAM:
CT ANGIOGRAPHY CHEST
CT ABDOMEN AND PELVIS WITH CONTRAST
TECHNIQUE: Multidetector CT imaging of the chest was performed using the
standard protocol during bolus administration of intravenous
contrast. Multiplanar CT image reconstructions and MIPs were
obtained to evaluate the vascular anatomy. Multidetector CT imaging
of the abdomen and pelvis was performed using the standard protocol
during bolus administration of intravenous contrast.
CONTRAST:  100mL OMNIPAQUE IOHEXOL 350 MG/ML SOLN

[Series 2: axial st · axial · 0.90mm/px · z∈[-717,-242]mm · 12 of 107 slices shown, 14 images]
[im 6/107  soft-tissue]
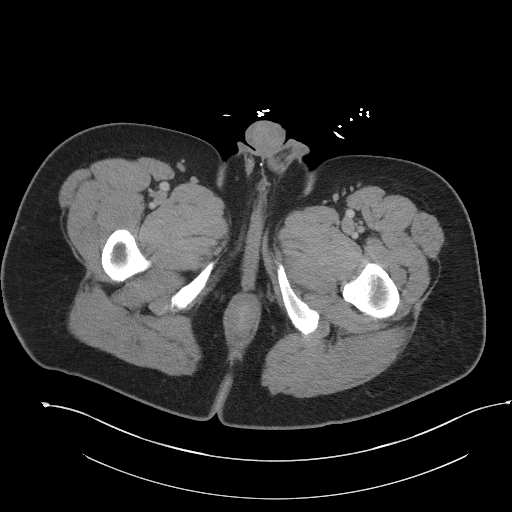
[im 6/107  bone]
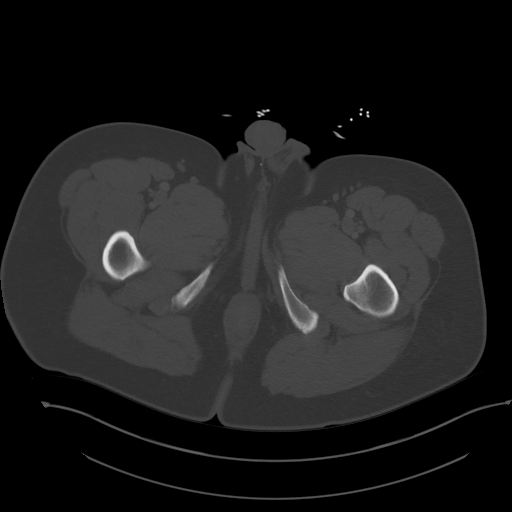
[im 18/107  soft-tissue]
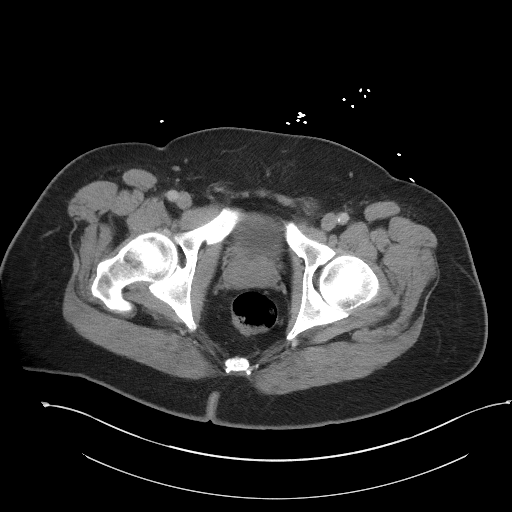
[im 24/107  soft-tissue]
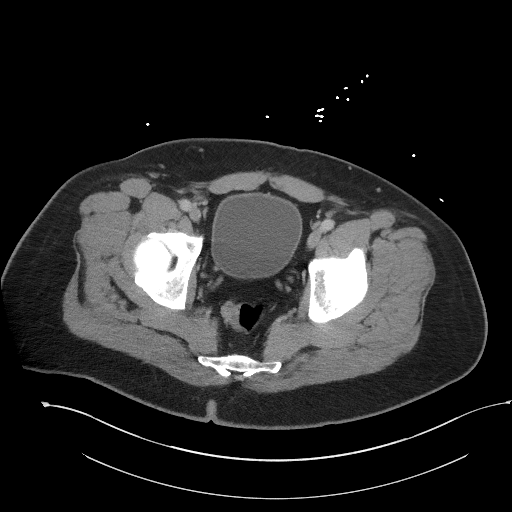
[im 30/107  soft-tissue]
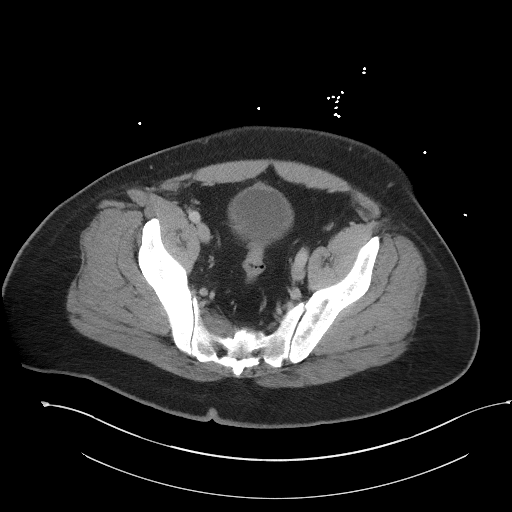
[im 42/107  soft-tissue]
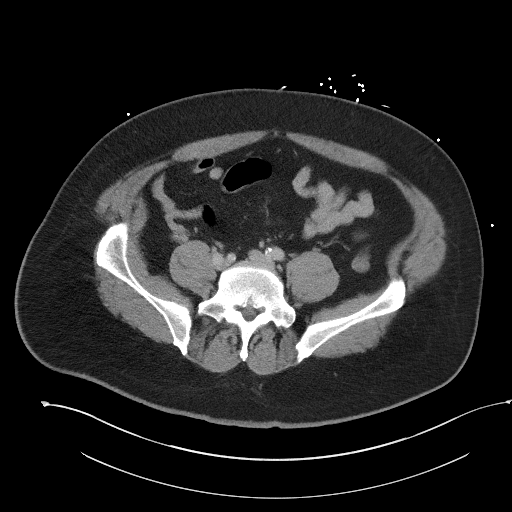
[im 48/107  soft-tissue]
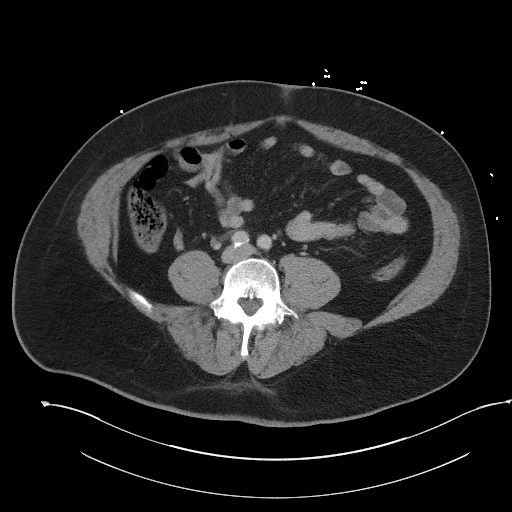
[im 59/107  soft-tissue]
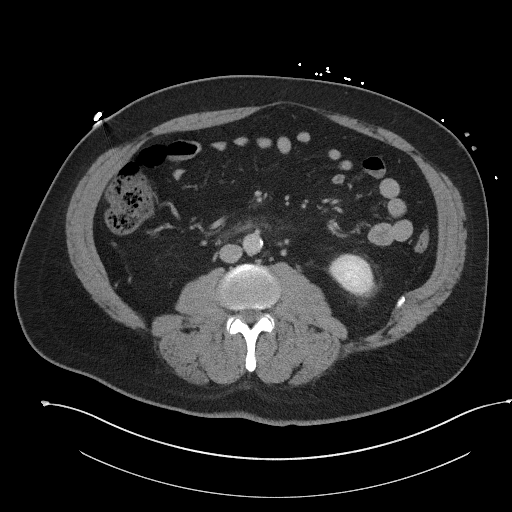
[im 65/107  soft-tissue]
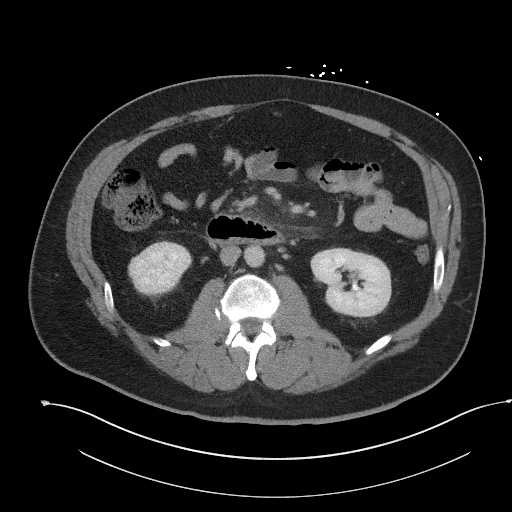
[im 77/107  soft-tissue]
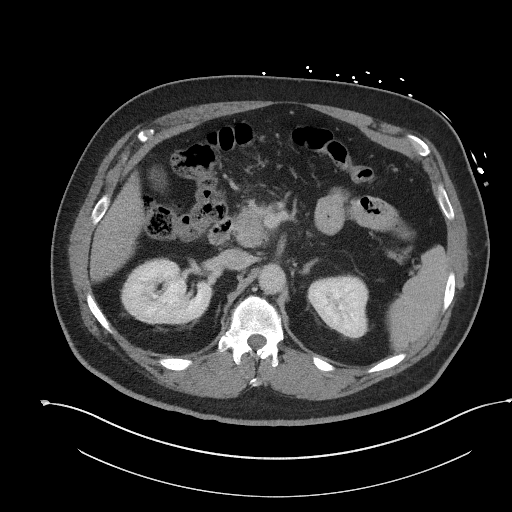
[im 77/107  bone]
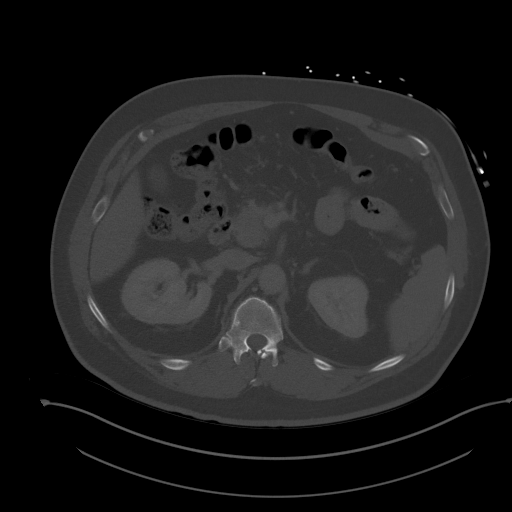
[im 83/107  soft-tissue]
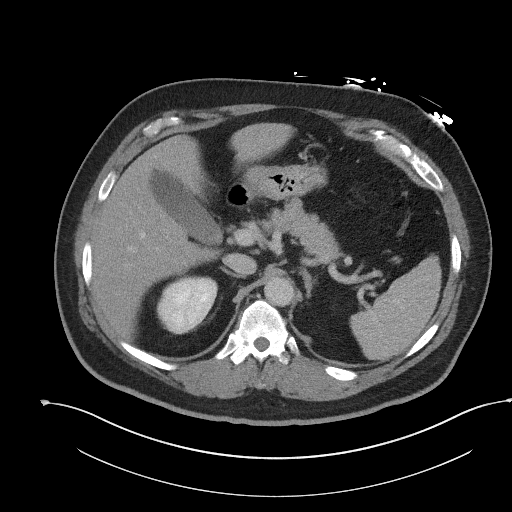
[im 89/107  soft-tissue]
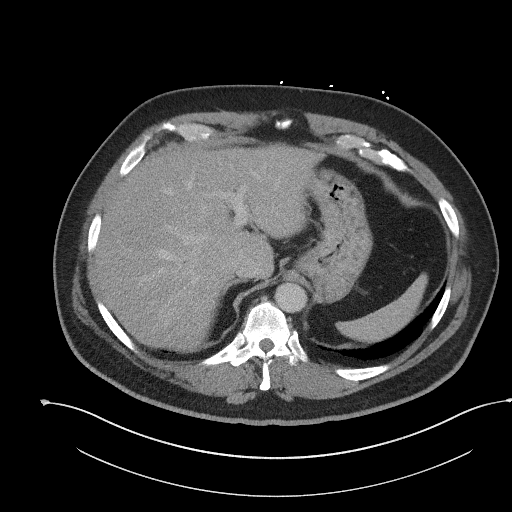
[im 101/107  soft-tissue]
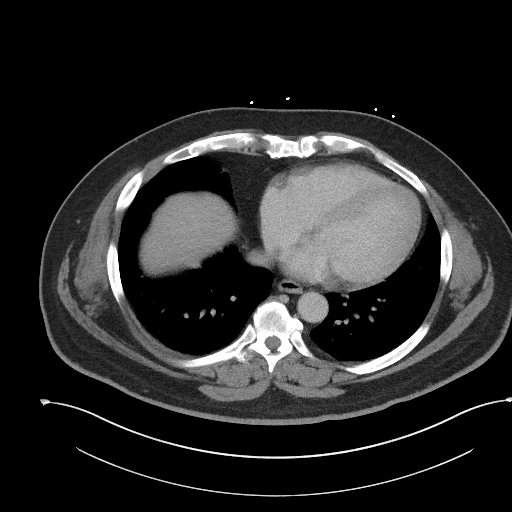

[Series 5: coronal st · coronal · 0.81mm/px · 3 of 112 slices shown]
[im 38/112  soft-tissue]
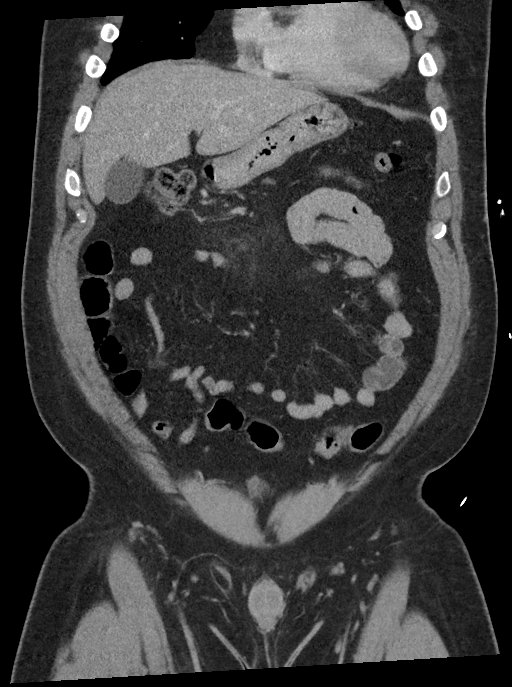
[im 50/112  soft-tissue]
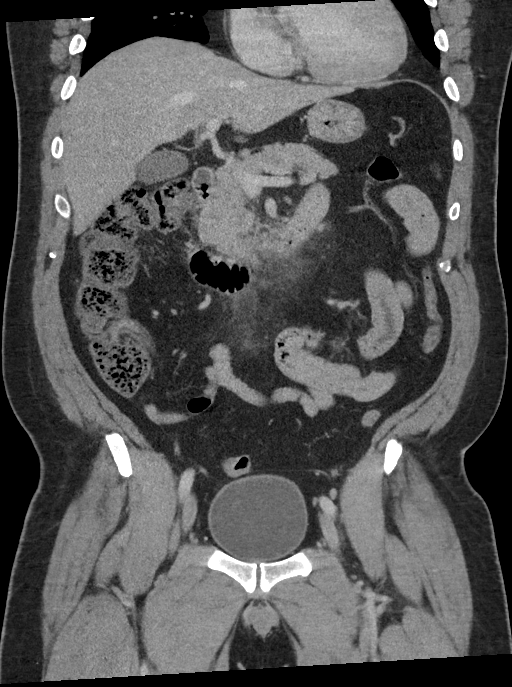
[im 62/112  soft-tissue]
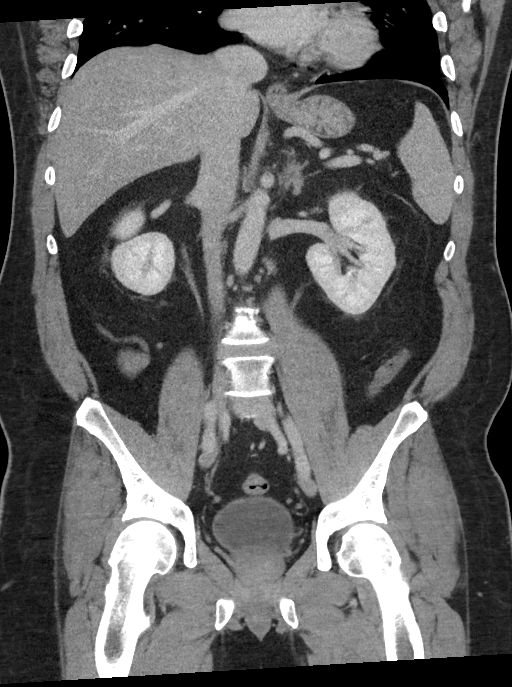

[15 of 46 positions shown; findings below may reference images not displayed]

FINDINGS: CTA CHEST FINDINGS

Cardiovascular: Satisfactory opacification of the pulmonary arteries
to the segmental level. No evidence of pulmonary embolism. Normal
heart size. No pericardial effusion. No thoracic aortic aneurysm or
dissection.

Mediastinum/Nodes: No enlarged mediastinal, hilar, or axillary lymph
nodes. Thyroid gland, trachea, and esophagus demonstrate no
significant findings.

Lungs/Pleura: Lungs are clear. No pleural effusion or pneumothorax.

Musculoskeletal: No chest wall abnormality. No acute or significant
osseous findings.

Review of the MIP images confirms the above findings.

CT ABDOMEN AND PELVIS FINDINGS

Hepatobiliary: Tiny subcentimeter low-density lesion in the
peripheral right hepatic lobe, too small to characterize. No other
focal liver abnormality. The gallbladder is unremarkable. No biliary
dilatation.

Pancreas: Ill-defined inferior pancreatic head and uncinate process
with surrounding inflammatory changes (series 2, image 39). No
ductal dilatation.

Spleen: Normal in size without focal abnormality.

Adrenals/Urinary Tract: Adrenal glands are unremarkable. Kidneys are
normal, without renal calculi, focal lesion, or hydronephrosis.
Partially duplicated right renal collecting system. Bladder is
unremarkable.

Stomach/Bowel: Stomach is within normal limits. Appendix appears
normal. No evidence of bowel wall thickening, distention, or
inflammatory changes.

Vascular/Lymphatic: Aortic atherosclerosis. No enlarged abdominal or
pelvic lymph nodes.

Reproductive: Prostate is unremarkable.

Other: No abdominal wall hernia or abnormality. No abdominopelvic
ascites. No pneumoperitoneum.

Musculoskeletal: No acute or significant osseous findings.

Review of the MIP images confirms the above findings.
IMPRESSION: CT chest:

1. No evidence of pulmonary embolism. No acute intrathoracic
process.

CT abdomen pelvis:

1. Ill-defined inferior pancreatic head and uncinate process with
surrounding inflammatory changes, suggestive of acute pancreatitis.
The differential diagnosis also includes duodenitis, although there
is no overt duodenal wall thickening.
2. Aortic Atherosclerosis ([id number]).

## 2021-11-28 ENCOUNTER — Telehealth: Payer: Self-pay | Admitting: Cardiovascular Disease

## 2021-11-28 NOTE — Telephone Encounter (Signed)
Attempted to schedule 3 times, deleting from recall.
